# Patient Record
Sex: Male | Born: 1955 | Race: White | Hispanic: No | Marital: Married | Smoking: Former smoker
Health system: Southern US, Community
[De-identification: ages and names within clinical notes are randomized; demographics above are authoritative.]

## PROBLEM LIST (undated history)

## (undated) DIAGNOSIS — M1990 Unspecified osteoarthritis, unspecified site: Secondary | ICD-10-CM

## (undated) DIAGNOSIS — R112 Nausea with vomiting, unspecified: Secondary | ICD-10-CM

## (undated) DIAGNOSIS — Z9889 Other specified postprocedural states: Secondary | ICD-10-CM

## (undated) DIAGNOSIS — T4145XA Adverse effect of unspecified anesthetic, initial encounter: Secondary | ICD-10-CM

## (undated) DIAGNOSIS — T8859XA Other complications of anesthesia, initial encounter: Secondary | ICD-10-CM

## (undated) HISTORY — PX: APPENDECTOMY: SHX54

## (undated) HISTORY — PX: SINUS SURGERY WITH INSTATRAK: SHX5215

---

## 1999-10-13 ENCOUNTER — Inpatient Hospital Stay (HOSPITAL_COMMUNITY): Admission: AD | Admit: 1999-10-13 | Discharge: 1999-10-17 | Payer: Self-pay | Admitting: Internal Medicine

## 1999-10-17 ENCOUNTER — Encounter: Payer: Self-pay | Admitting: Internal Medicine

## 2003-11-02 ENCOUNTER — Encounter: Admission: RE | Admit: 2003-11-02 | Discharge: 2003-11-02 | Payer: Self-pay | Admitting: Neurosurgery

## 2010-04-29 ENCOUNTER — Encounter: Payer: Self-pay | Admitting: Neurosurgery

## 2011-04-12 ENCOUNTER — Ambulatory Visit (INDEPENDENT_AMBULATORY_CARE_PROVIDER_SITE_OTHER): Payer: BC Managed Care – PPO

## 2011-04-12 DIAGNOSIS — M62838 Other muscle spasm: Secondary | ICD-10-CM

## 2011-04-12 DIAGNOSIS — J301 Allergic rhinitis due to pollen: Secondary | ICD-10-CM

## 2011-04-12 DIAGNOSIS — J019 Acute sinusitis, unspecified: Secondary | ICD-10-CM

## 2011-04-12 DIAGNOSIS — M542 Cervicalgia: Secondary | ICD-10-CM

## 2011-08-01 ENCOUNTER — Ambulatory Visit (INDEPENDENT_AMBULATORY_CARE_PROVIDER_SITE_OTHER): Payer: BC Managed Care – PPO | Admitting: Physician Assistant

## 2011-08-01 VITALS — BP 117/72 | HR 60 | Temp 98.1°F | Resp 16 | Ht 70.5 in | Wt 221.2 lb

## 2011-08-01 DIAGNOSIS — E7800 Pure hypercholesterolemia, unspecified: Secondary | ICD-10-CM | POA: Insufficient documentation

## 2011-08-01 DIAGNOSIS — S30861A Insect bite (nonvenomous) of abdominal wall, initial encounter: Secondary | ICD-10-CM

## 2011-08-01 DIAGNOSIS — S30860A Insect bite (nonvenomous) of lower back and pelvis, initial encounter: Secondary | ICD-10-CM

## 2011-08-01 DIAGNOSIS — E782 Mixed hyperlipidemia: Secondary | ICD-10-CM

## 2011-08-01 DIAGNOSIS — L299 Pruritus, unspecified: Secondary | ICD-10-CM

## 2011-08-01 MED ORDER — CETIRIZINE HCL 10 MG PO TABS: 10.0000 mg | ORAL_TABLET | Freq: Every day | ORAL | Status: DC

## 2011-08-01 MED ORDER — DOXYCYCLINE HYCLATE 100 MG PO TABS: 100.0000 mg | ORAL_TABLET | Freq: Two times a day (BID) | ORAL | Status: AC

## 2011-08-01 NOTE — Progress Notes (Signed)
  Subjective:    Patient ID: Stephen Cannon, male    DOB: [birth year], 56 y.o.   MRN: [id number]  HPI 56 yo CM presents S/P pulling a deer tick from his L inguinal region yesterday. He first noticed it bc the area was pruritic.  Today he noticed a rash. He has the tick with him that appears have its mouth parts in tact.  He works for the [employer] and at times goes in [employer], etc. He thinks it couldn't have been there more than a couple of days.  Review of Systems  All other systems reviewed and are negative.       Objective:   Physical Exam  Constitutional: He appears well-developed and well-nourished.  HENT:  Head: Normocephalic and atraumatic.  Neck: Normal range of motion. Neck supple.  Cardiovascular: Normal rate, regular rhythm and normal heart sounds.   Pulmonary/Chest: Effort normal and breath sounds normal.  Skin: Skin is warm and dry.             Assessment & Plan:  Deer tick bite with rash-appears more like a localized skin reaction.  Will cover for tick disease indigenous to this area

## 2011-09-21 ENCOUNTER — Other Ambulatory Visit: Payer: Self-pay | Admitting: *Deleted

## 2011-09-21 ENCOUNTER — Ambulatory Visit (INDEPENDENT_AMBULATORY_CARE_PROVIDER_SITE_OTHER): Payer: BC Managed Care – PPO | Admitting: Family Medicine

## 2011-09-21 VITALS — BP 111/67 | HR 75 | Temp 97.9°F | Resp 18 | Ht 71.0 in | Wt 228.0 lb

## 2011-09-21 DIAGNOSIS — R05 Cough: Secondary | ICD-10-CM

## 2011-09-21 DIAGNOSIS — I499 Cardiac arrhythmia, unspecified: Secondary | ICD-10-CM

## 2011-09-21 DIAGNOSIS — R059 Cough, unspecified: Secondary | ICD-10-CM

## 2011-09-21 DIAGNOSIS — I491 Atrial premature depolarization: Secondary | ICD-10-CM

## 2011-09-21 LAB — POCT CBC
HCT, POC: 46 % (ref 43.5–53.7)
Hemoglobin: 15.3 g/dL (ref 14.1–18.1)
Lymph, poc: 1.7 (ref 0.6–3.4)
MCHC: 33.3 g/dL (ref 31.8–35.4)
MPV: 9.5 fL (ref 0–99.8)
POC Granulocyte: 4.7 (ref 2–6.9)
POC MID %: 12.2 %M — AB (ref 0–12)
RBC: 5.13 M/uL (ref 4.69–6.13)
WBC: 7.3 10*3/uL (ref 4.6–10.2)

## 2011-09-21 MED ORDER — BENZONATATE 100 MG PO CAPS: ORAL_CAPSULE | ORAL | Status: AC

## 2011-09-21 MED ORDER — CETIRIZINE HCL 10 MG PO TABS: 10.0000 mg | ORAL_TABLET | Freq: Every day | ORAL | Status: DC

## 2011-09-21 MED ORDER — BENZONATATE 100 MG PO CAPS: ORAL_CAPSULE | ORAL | Status: DC

## 2011-09-21 NOTE — Progress Notes (Signed)
Subjective: This 56-year-old man who is here with her history of having a lot of congestion. This is not typical for him in the summertime. He doesn't know what he picked up something from someone else had been sick at work or not. He has had head congestion, postnasal drainage, some yellow mucus from his nose, and a cough. He has used Vicks on his chest and under his nose. He has taken OTC Zyrtec today. Last night he broke into a sweat. He has not had any documented fevers. He denies having any palpitations.  Objective: TMs normal the right dull on the left. Nose congested. Throat clear. Neck supple without significant nodes. Chest is clear to auscultation. He is coughing some. Heart regular but has frequent ectopic beats, with some little brief runs of ectopic beats.  Assessment: Cough Irregular heartbeats  Plan: EKG We'll also check a couple of labs including CBC, CMET, and TSH. ekg single pac in 10 pages of rhythm strip

## 2011-09-21 NOTE — Patient Instructions (Addendum)
Call if not better by Tuesday, or if getting worse and we will begin antibiotics

## 2011-09-22 LAB — TSH: TSH: 2.49 u[IU]/mL (ref 0.350–4.500)

## 2011-09-22 LAB — COMPREHENSIVE METABOLIC PANEL
ALT: 22 U/L (ref 0–53)
AST: 19 U/L (ref 0–37)
Albumin: 4.4 g/dL (ref 3.5–5.2)
CO2: 29 mEq/L (ref 19–32)
Calcium: 9.1 mg/dL (ref 8.4–10.5)
Chloride: 107 mEq/L (ref 96–112)
Creat: 1.01 mg/dL (ref 0.50–1.35)
Potassium: 4.4 mEq/L (ref 3.5–5.3)
Sodium: 143 mEq/L (ref 135–145)
Total Protein: 6.3 g/dL (ref 6.0–8.3)

## 2011-09-23 ENCOUNTER — Encounter: Payer: Self-pay | Admitting: Family Medicine

## 2011-09-25 ENCOUNTER — Telehealth: Payer: Self-pay

## 2011-09-25 NOTE — Telephone Encounter (Signed)
Call patient: Treat with amoxicillin 875 mg bid for 10 days (#20)

## 2011-09-25 NOTE — Telephone Encounter (Signed)
Spoke with patient and he stated that he was producing a lot of yellowish mucus.  He is still stopped up and coughing.  Patient would like an antibiotic. He is leaving for the beach tonight.

## 2011-09-25 NOTE — Telephone Encounter (Signed)
PT STATES HE WAS SEEN THE OTHER DAY AND NOW HE IS COUGHING UP YELLOWISH MUCUS, HE ISN'T ANY WORSE OR ANY BETTER, THINK MAYBE HE NEED TO BE ON AN ANTIBIOTIC. IS LEAVING LATE THIS EVENING TO GO TO THE BEACH AND REALLY HOPE TO HEAR FROM SOMEONE BY THEN PLEASE CALL [phone]   [place]

## 2011-09-25 NOTE — Telephone Encounter (Signed)
LMOM that abx was called into [facility]

## 2011-09-30 ENCOUNTER — Ambulatory Visit (INDEPENDENT_AMBULATORY_CARE_PROVIDER_SITE_OTHER): Payer: BC Managed Care – PPO | Admitting: Family Medicine

## 2011-09-30 VITALS — BP 134/84 | HR 61 | Resp 16 | Ht 70.5 in | Wt 224.0 lb

## 2011-09-30 DIAGNOSIS — Z0000 Encounter for general adult medical examination without abnormal findings: Secondary | ICD-10-CM

## 2011-09-30 LAB — POCT UA - MICROSCOPIC ONLY
Bacteria, U Microscopic: NEGATIVE
Casts, Ur, LPF, POC: NEGATIVE
Crystals, Ur, HPF, POC: NEGATIVE
Mucus, UA: NEGATIVE
Yeast, UA: NEGATIVE

## 2011-09-30 LAB — POCT URINALYSIS DIPSTICK
Bilirubin, UA: NEGATIVE
Glucose, UA: NEGATIVE
Ketones, UA: NEGATIVE
Leukocytes, UA: NEGATIVE
Nitrite, UA: NEGATIVE
Protein, UA: NEGATIVE
Spec Grav, UA: 1.02
Urobilinogen, UA: 0.2
pH, UA: 5.5

## 2011-09-30 LAB — POCT CBC
Granulocyte percent: 65.4 %G (ref 37–80)
HCT, POC: 46.5 % (ref 43.5–53.7)
Hemoglobin: 15.1 g/dL (ref 14.1–18.1)
Lymph, poc: 1.8 (ref 0.6–3.4)
MCH, POC: 28.9 pg (ref 27–31.2)
MCHC: 32.5 g/dL (ref 31.8–35.4)
MCV: 89 fL (ref 80–97)
MID (cbc): 0.5 (ref 0–0.9)
MPV: 9.1 fL (ref 0–99.8)
POC Granulocyte: 4.4 (ref 2–6.9)
POC LYMPH PERCENT: 26.5 %L (ref 10–50)
POC MID %: 8.1 %M (ref 0–12)
Platelet Count, POC: 234 10*3/uL (ref 142–424)
RBC: 5.23 M/uL (ref 4.69–6.13)
RDW, POC: 14.2 %
WBC: 6.7 10*3/uL (ref 4.6–10.2)

## 2011-09-30 LAB — IFOBT (OCCULT BLOOD): IFOBT: NEGATIVE

## 2011-09-30 NOTE — Progress Notes (Signed)
@[email or web address] @  Patient ID: Stephen Cannon MRN: [id number], DOB: 09-04-1955 56 y.o. Stephen Cannon MRN: [id number], DOB: 09-04-1955 56 y.o. Date of Encounter: 09/30/2011, 6:46 PM  Primary Physician: Tally Due, MD  Chief Complaint: Physical (CPE)  HPI: 56 y.o. y/o male with history noted below here for CPE.  Doing well.  [place] No issues/complaints.  Now working as Midwife for [employer]  Review of Systems: Consitutional: No fever, chills, fatigue, night sweats, lymphadenopathy, or weight changes. Eyes: No visual changes, eye redness, or discharge. ENT/Mouth: Ears: No otalgia, tinnitus, hearing loss, discharge. Nose: No congestion, rhinorrhea, sinus pain, or epistaxis. Throat: No sore throat, post nasal drip, or teeth pain. Cardiovascular: No CP, palpitations, diaphoresis, DOE, edema, orthopnea, PND. Respiratory: No cough, hemoptysis, SOB, or wheezing. Gastrointestinal: No anorexia, dysphagia, reflux, pain, nausea, vomiting, hematemesis, diarrhea, constipation, BRBPR, or melena. Genitourinary: No dysuria, frequency, urgency, hematuria, incontinence, nocturia, decreased urinary stream, discharge, impotence, or testicular pain/masses. Musculoskeletal: No decreased ROM, myalgias, stiffness, joint swelling, or weakness. Skin: No rash, erythema, lesion changes, pain, warmth, jaundice, or pruritis. Neurological: No headache, dizziness, syncope, seizures, tremors, memory loss, coordination problems, or paresthesias. Psychological: No anxiety, depression, hallucinations, SI/HI. Endocrine: No fatigue, polydipsia, polyphagia, polyuria, or known diabetes. All other systems were reviewed and are otherwise negative.  No past medical history on file.   Past Surgical History  Procedure Date  . Appendectomy     Home Meds:  Prior to Admission medications   Medication Sig Start Date End Date Taking? Authorizing Provider  aspirin 81 MG tablet Take 81 mg by mouth daily.   Yes Historical Provider, MD  Multiple Vitamin (MULTIVITAMIN) tablet Take 1 tablet  by mouth daily.   Yes Historical Provider, MD  cetirizine (ZYRTEC) 10 MG tablet Take 1 tablet (10 mg total) by mouth daily. 09/21/11 09/20/12  Peyton Najjar, MD  pravastatin (PRAVACHOL) 40 MG tablet Take 40 mg by mouth daily.    Historical Provider, MD    Allergies: No Known Allergies  History   Social History  . Marital Status: Single    Spouse Name: N/A    Number of Children: N/A  . Years of Education: N/A   Occupational History  . Not on file.   Social History Main Topics  . Smoking status: Never Smoker   . Smokeless tobacco: Not on file  . Alcohol Use: No  . Drug Use: No  . Sexually Active: Yes   Other Topics Concern  . Not on file   Social History Narrative  . No narrative on file    No family history on file.  Physical Exam: Blood pressure 134/84, pulse 61, resp. rate 16, height 5' 10.5" (1.791 m), weight 224 lb (101.606 kg).  General: Well developed, well nourished, in no acute distress. HEENT: Normocephalic, atraumatic. Conjunctiva pink, sclera non-icteric. Pupils 2 mm constricting to 1 mm, round, regular, and equally reactive to light and accomodation. EOMI. Internal auditory canal clear. TMs with good cone of light and without pathology. Nasal mucosa pink. Nares are without discharge. No sinus tenderness. Oral mucosa pink. Dentition good. Pharynx without exudate.   Neck: Supple. Trachea midline. No thyromegaly. Full ROM. No lymphadenopathy. Lungs: Clear to auscultation bilaterally without wheezes, rales, or rhonchi. Breathing is of normal effort and unlabored. Cardiovascular: RRR with S1 S2. No murmurs, rubs, or gallops appreciated. Distal pulses 2+ symmetrically. No carotid or abdominal bruits. Abdomen: Soft, non-tender, non-distended with normoactive bowel sounds. No hepatosplenomegaly or masses. No rebound/guarding. No CVA tenderness. Without hernias.  Rectal: No external hemorrhoids or fissures. Rectal vault without  masses.  Genitourinary:  circumcised male. No  penile lesions. Testes descended bilaterally, and smooth without tenderness or masses.  Musculoskeletal: Full range of motion and 5/5 strength throughout. Without swelling, atrophy, tenderness, crepitus, or warmth. Extremities without clubbing, cyanosis, or edema. Calves supple. Skin: Warm and moist without erythema, ecchymosis, wounds, or rash. Neuro: A+Ox3. CN II-XII grossly intact. Moves all extremities spontaneously. Full sensation throughout. Normal gait. DTR 2+ throughout upper and lower extremities. Finger to nose intact. Psych:  Responds to questions appropriately with a normal affect.   Studies:  Patient is healthy.  Forms completed   Assessment/Plan:  56 y.o. y/o healthy male here for CPE -  Signed, Elvina Sidle, MD 09/30/2011 6:46 PM

## 2011-10-01 LAB — COMPREHENSIVE METABOLIC PANEL
ALT: 23 U/L (ref 0–53)
AST: 24 U/L (ref 0–37)
Albumin: 5.1 g/dL (ref 3.5–5.2)
Alkaline Phosphatase: 47 U/L (ref 39–117)
BUN: 14 mg/dL (ref 6–23)
CO2: 28 mEq/L (ref 19–32)
Calcium: 9.5 mg/dL (ref 8.4–10.5)
Chloride: 103 mEq/L (ref 96–112)
Creat: 0.97 mg/dL (ref 0.50–1.35)
Glucose, Bld: 75 mg/dL (ref 70–99)
Potassium: 4.3 mEq/L (ref 3.5–5.3)
Sodium: 139 mEq/L (ref 135–145)
Total Bilirubin: 0.5 mg/dL (ref 0.3–1.2)
Total Protein: 7 g/dL (ref 6.0–8.3)

## 2011-10-01 LAB — LIPID PANEL
Cholesterol: 229 mg/dL — ABNORMAL HIGH (ref 0–200)
HDL: 50 mg/dL (ref >39)
LDL Cholesterol: 158 mg/dL — ABNORMAL HIGH (ref 0–99)
Total CHOL/HDL Ratio: 4.6 Ratio
Triglycerides: 106 mg/dL (ref <150)
VLDL: 21 mg/dL (ref 0–40)

## 2011-10-03 ENCOUNTER — Other Ambulatory Visit: Payer: Self-pay | Admitting: Family Medicine

## 2011-10-03 DIAGNOSIS — E785 Hyperlipidemia, unspecified: Secondary | ICD-10-CM

## 2011-10-03 MED ORDER — PRAVASTATIN SODIUM 80 MG PO TABS: 80.0000 mg | ORAL_TABLET | Freq: Every day | ORAL | Status: DC

## 2011-10-05 ENCOUNTER — Encounter: Payer: Self-pay | Admitting: Radiology

## 2011-10-13 ENCOUNTER — Other Ambulatory Visit: Payer: Self-pay | Admitting: Emergency Medicine

## 2011-10-13 ENCOUNTER — Other Ambulatory Visit: Payer: Self-pay | Admitting: Physician Assistant

## 2011-10-13 MED ORDER — PRAVASTATIN SODIUM 40 MG PO TABS: 40.0000 mg | ORAL_TABLET | Freq: Every day | ORAL | Status: AC

## 2011-12-09 ENCOUNTER — Ambulatory Visit (INDEPENDENT_AMBULATORY_CARE_PROVIDER_SITE_OTHER): Payer: BC Managed Care – PPO | Admitting: Physician Assistant

## 2011-12-09 VITALS — BP 125/79 | HR 88 | Temp 98.0°F | Resp 16 | Ht 70.5 in | Wt 229.0 lb

## 2011-12-09 DIAGNOSIS — J309 Allergic rhinitis, unspecified: Secondary | ICD-10-CM

## 2011-12-09 DIAGNOSIS — H1010 Acute atopic conjunctivitis, unspecified eye: Secondary | ICD-10-CM

## 2011-12-09 DIAGNOSIS — H1045 Other chronic allergic conjunctivitis: Secondary | ICD-10-CM

## 2011-12-09 MED ORDER — IPRATROPIUM BROMIDE 0.03 % NA SOLN: 2.0000 | Freq: Two times a day (BID) | NASAL | Status: DC

## 2011-12-09 MED ORDER — AZELASTINE HCL 0.05 % OP SOLN: 1.0000 [drp] | Freq: Two times a day (BID) | OPHTHALMIC | Status: DC

## 2011-12-09 MED ORDER — POLYMYXIN B-TRIMETHOPRIM 10000-0.1 UNIT/ML-% OP SOLN: 1.0000 [drp] | OPHTHALMIC | Status: AC

## 2011-12-09 NOTE — Patient Instructions (Addendum)
Take Zyrtec daily in the morning and Benadryl 25-50 mg at bedtime Use azelastine eye drops to both eyes to help with itching If no improvement tomorrow, call and we will refer to eye doctor.

## 2011-12-09 NOTE — Progress Notes (Signed)
  Subjective:    Patient ID: Stephen Cannon, male    DOB: 10-24-1955, 56 y.o.   MRN: [id number]  Eye Problem    56 year old male presents with acute onset of eye redness, irritation, and pruritis.  He was working in the yard this morning and there was grass blowing in his face and eyes. He was wearing eye protection but believes he may have still been exposed to something.  Admits to clear rhinorrhea, nasal congestion, watery drainage from both eyes, and eye erythema and foreign body sensation.  Denies SOB, wheezing, headache, lip/tongue swelling. Denies eye pain, mostly complains of itching.     Review of Systems  All other systems reviewed and are negative.       Objective:   Physical Exam  Constitutional: He is oriented to person, place, and time. He appears well-developed and well-nourished.  HENT:  Head: Normocephalic and atraumatic.  Right Ear: Hearing, tympanic membrane, external ear and ear canal normal.  Left Ear: Hearing, tympanic membrane, external ear and ear canal normal.  Mouth/Throat: Uvula is midline, oropharynx is clear and moist and mucous membranes are normal. No oropharyngeal exudate.  Eyes: EOM are normal. Pupils are equal, round, and reactive to light. Right conjunctiva is injected. Left conjunctiva is injected.       Right eyelid slightly inflamed  Neck: Normal range of motion. Neck supple.  Cardiovascular: Normal rate, regular rhythm and normal heart sounds.   Pulmonary/Chest: Effort normal and breath sounds normal.  Lymphadenopathy:    He has no cervical adenopathy.  Neurological: He is alert and oriented to person, place, and time.  Psychiatric: He has a normal mood and affect. His behavior is normal. Judgment and thought content normal.          Assessment & Plan:   1. Allergic conjunctivitis  Use Optivar drops twice daily to help with erythema and pruritis. Start Zyrtec daily in the morning and Benadryl at night azelastine (OPTIVAR) 0.05 %  ophthalmic solution  2. Allergic rhinitis  Patient declined prednisone today ipratropium (ATROVENT) 0.03 % nasal spray  Instructed him to let us know if symptoms are worsening or fail to improve.  May need to consider opthalmology referral if no improvement.

## 2011-12-10 ENCOUNTER — Telehealth: Payer: Self-pay

## 2011-12-10 NOTE — Telephone Encounter (Signed)
I have spoken to patient and he is still having trouble with eyes left greater than right. He has seen Dr Dione Booze in the past and was given his number to self schedule an appt. I have faxed note to Dr Dione Booze for his review.

## 2011-12-10 NOTE — Telephone Encounter (Signed)
PT STATES HE WAS SEEN FOR AN ALLERGIC REACTION YESTERDAY AND HIS EYES ARE REALLY SENSITIVE AND RED. PLEASE CALL [phone]

## 2012-01-29 ENCOUNTER — Encounter: Payer: Self-pay | Admitting: *Deleted

## 2012-01-29 DIAGNOSIS — H1010 Acute atopic conjunctivitis, unspecified eye: Secondary | ICD-10-CM | POA: Insufficient documentation

## 2012-07-21 ENCOUNTER — Ambulatory Visit: Payer: BC Managed Care – PPO | Admitting: Emergency Medicine

## 2012-08-03 ENCOUNTER — Ambulatory Visit: Payer: BC Managed Care – PPO | Admitting: Emergency Medicine

## 2012-08-11 ENCOUNTER — Ambulatory Visit (INDEPENDENT_AMBULATORY_CARE_PROVIDER_SITE_OTHER): Payer: BC Managed Care – PPO | Admitting: Emergency Medicine

## 2012-08-11 ENCOUNTER — Encounter: Payer: Self-pay | Admitting: Emergency Medicine

## 2012-08-11 VITALS — BP 116/74 | HR 65 | Temp 98.8°F | Resp 16 | Ht 71.0 in | Wt 231.0 lb

## 2012-08-11 DIAGNOSIS — R5383 Other fatigue: Secondary | ICD-10-CM

## 2012-08-11 DIAGNOSIS — R0989 Other specified symptoms and signs involving the circulatory and respiratory systems: Secondary | ICD-10-CM

## 2012-08-11 DIAGNOSIS — R0683 Snoring: Secondary | ICD-10-CM

## 2012-08-11 DIAGNOSIS — E785 Hyperlipidemia, unspecified: Secondary | ICD-10-CM

## 2012-08-11 LAB — LIPID PANEL
HDL: 58 mg/dL (ref >39)
LDL Cholesterol: 101 mg/dL — ABNORMAL HIGH (ref 0–99)
Triglycerides: 90 mg/dL (ref <150)

## 2012-08-11 NOTE — Progress Notes (Signed)
  Subjective:    Patient ID: DAVARIUS RIDENER, male    DOB: 1955-10-05, 57 y.o.   MRN: [id number]  HPI  57 y.o male patient here today to follow up on his cholesterol. He had stopped taking his cholesterol medication for a little bit due to muscle aches. He restarted it about 2 months later after having blood work done. As a result of that blood work it was recommended he should double his dose. He decided to just restart the medication and watch his diet.  He has been taking Pravastatin for about 10 months. Patient states he is fasting today to have blood drawn.    Patient reports he is also here today to talk about a snoring. His wife has complained about this to him numerous times. He will turn onto his left side and it does stop. He has had some day time fatigue. He has increased his pillows to two at night and he reports it has gotten better. He denies acid reflux. He has been having issues with nasal congestion. Especially on his left side. He was wondering if he should have a sleep study.    Review of Systems      Objective:   Physical Exam Chest clear Heart RRR      Assessment & Plan:  Check sleep study. Check Lipid Panel

## 2012-08-12 ENCOUNTER — Encounter: Payer: Self-pay | Admitting: *Deleted

## 2012-08-18 ENCOUNTER — Ambulatory Visit (INDEPENDENT_AMBULATORY_CARE_PROVIDER_SITE_OTHER): Payer: BC Managed Care – PPO | Admitting: Family Medicine

## 2012-08-18 VITALS — BP 138/76 | HR 103 | Temp 100.4°F | Resp 16 | Ht 71.75 in | Wt 231.6 lb

## 2012-08-18 DIAGNOSIS — R5381 Other malaise: Secondary | ICD-10-CM

## 2012-08-18 DIAGNOSIS — R51 Headache: Secondary | ICD-10-CM

## 2012-08-18 DIAGNOSIS — R6889 Other general symptoms and signs: Secondary | ICD-10-CM

## 2012-08-18 DIAGNOSIS — IMO0001 Reserved for inherently not codable concepts without codable children: Secondary | ICD-10-CM

## 2012-08-18 DIAGNOSIS — M2550 Pain in unspecified joint: Secondary | ICD-10-CM

## 2012-08-18 DIAGNOSIS — J111 Influenza due to unidentified influenza virus with other respiratory manifestations: Secondary | ICD-10-CM

## 2012-08-18 LAB — POCT CBC
Lymph, poc: 0.3 — AB (ref 0.6–3.4)
MCH, POC: 29.9 pg (ref 27–31.2)
MCHC: 32.2 g/dL (ref 31.8–35.4)
MID (cbc): 0.2 (ref 0–0.9)
MPV: 9.7 fL (ref 0–99.8)
POC Granulocyte: 4.8 (ref 2–6.9)
POC LYMPH PERCENT: 5.9 %L — AB (ref 10–50)
POC MID %: 3.7 %M (ref 0–12)
Platelet Count, POC: 153 10*3/uL (ref 142–424)
RDW, POC: 13.6 %
WBC: 5.3 10*3/uL (ref 4.6–10.2)

## 2012-08-18 LAB — COMPREHENSIVE METABOLIC PANEL
ALT: 25 U/L (ref 0–53)
Alkaline Phosphatase: 42 U/L (ref 39–117)
CO2: 25 mEq/L (ref 19–32)
Creat: 0.92 mg/dL (ref 0.50–1.35)
Glucose, Bld: 105 mg/dL — ABNORMAL HIGH (ref 70–99)
Total Bilirubin: 0.8 mg/dL (ref 0.3–1.2)

## 2012-08-18 LAB — POCT SEDIMENTATION RATE: POCT SED RATE: 13 mm/hr (ref 0–22)

## 2012-08-18 LAB — POCT UA - MICROSCOPIC ONLY
Bacteria, U Microscopic: NEGATIVE
Casts, Ur, LPF, POC: NEGATIVE
Crystals, Ur, HPF, POC: NEGATIVE
Yeast, UA: NEGATIVE

## 2012-08-18 LAB — POCT URINALYSIS DIPSTICK
Glucose, UA: NEGATIVE
Nitrite, UA: NEGATIVE
Spec Grav, UA: 1.025
Urobilinogen, UA: 1

## 2012-08-18 LAB — POCT INFLUENZA A/B
Influenza A, POC: NEGATIVE
Influenza B, POC: NEGATIVE

## 2012-08-18 MED ORDER — DOXYCYCLINE HYCLATE 100 MG PO TABS: 100.0000 mg | ORAL_TABLET | Freq: Two times a day (BID) | ORAL | Status: DC

## 2012-08-18 NOTE — Patient Instructions (Addendum)
[place] Spotted Fever [place] Spotted Fever (RMSF) is the oldest known tick-borne disease of people in the [place]. This disease was named because it was first described among people in the [place] area who had an illness characterized by a rash with red-purple-black spots. This disease is caused by a rickettsia (Rickettsia rickettsii), a bacteria carried by the tick. The [place] wood tick and the [place] dog tick, acquire and transmit the RMSF bacteria (pictures NOT actual size). When a larval, nymphal or adult tick feeds on an infected rodent or larger animal, the tick can become infected. Infected adult ticks then feed on people who may then get RMSF. The tick transmits the disease to humans during a prolonged period of feeding that lasts many hours, days or even a couple weeks. The bite is painless and frequently goes unnoticed. An infected male tick may also pass the rickettsial bacteria to her eggs that then may mature to be infected adult ticks. The rickettsia that causes RMSF can also get into a person's body through damaged skin. A tick bite is not necessary. People can get RMSF if they crush a tick and get it's blood or body fluids on their skin through a small cut or sore.  DIAGNOSIS Diagnosis is made by laboratory tests.  TREATMENT Treatment is with antibiotics (medications that kill rickettsia and other bacteria). Immediate treatment usually prevents death. GEOGRAPHIC RANGE This disease was reported only in the [place] until 1931. RMSF has more recently been described among individuals in all states except [place], [place] and [place]. The highest reported incidences of RMSF now occur among residents of [place], [place], [place] and the [place]. TIME OF YEAR  Most cases are diagnosed during late spring and summer when ticks are most active. However, especially in the warmer southern states, a few cases occur during the winter. SYMPTOMS    Symptoms of RMSF begin from 2 to 14 days after a tick bite. The most common early symptoms are fever, muscle aches and headache followed by nausea (feeling sick to your stomach) or vomiting.  The RMSF rash is typically delayed until 3 or more days after symptom onset, and eventually develops in 9 of 10 infected patients by the 5th day of illness. If the disease is not treated it can cause death. If you get a fever, headache, muscle aches, rash, nausea or vomiting within 2 weeks of a possible tick bite or exposure you should see your caregiver immediately. PREVENTION Ticks prefer to hide in shady, moist ground litter. They can often be found above the ground clinging to tall grass, brush, shrubs and low tree branches. They also inhabit lawns and gardens, especially at the edges of woodlands and around old stone walls. Within the areas where ticks generally live, no naturally vegetated area can be considered completely free of infected ticks. The best precaution against RMSF is to avoid contact with soil, leaf litter and vegetation as much as possible in tick infested areas. For those who enjoy gardening or walking in their yards, clear brush and mow tall grass around houses and at the edges of gardens. This may help reduce the tick population in the immediate area. Applications of chemical insecticides by a licensed professional in the spring (late May) and Fall (September) will also control ticks, especially in heavily infested areas. Treatment will never get rid of all the ticks. Getting rid of small animal populations that host ticks will also decrease the tick population. When working in the garden,   pruning shrubs, or handling soil and vegetation, wear light-colored protective clothing and gloves. Spot-check often to prevent ticks from reaching the skin. Ticks cannot jump or fly. They will not drop from an above-ground perch onto a passing animal. Once a tick gains access to human skin it climbs upward  until it reaches a more protected area. For example, the back of the knee, groin, navel, armpit, ears or nape of the neck. It then begins the slow process of embedding itself in the skin. Campers, hikers, field workers, and others who spend time in wooded, brushy or tall grassy areas can avoid exposure to ticks by using the following precautions:  Wear light-colored clothing with a tight weave to spot ticks more easily and prevent contact with the skin.  Wear long pants tucked into socks, long-sleeved shirts tucked into pants and enclosed shoes or boots along with insect repellent.  Spray clothes with insect repellent containing either DEET or Permethrin. Only DEET can be used on exposed skin. Follow the manufacturer's directions carefully.  Wear a hat and keep long hair pulled back.  Stay on cleared, well-worn trails whenever possible.  Spot-check yourself and others often for the presence of ticks on clothes. If you find one, there are likely to be others. Check thoroughly.  Remove clothes after leaving tick-infested areas. If possible, wash them to eliminate any unseen ticks. Check yourself, your children and any pets from head to toe for the presence of ticks.  Shower and shampoo. You can greatly reduce your chances of contracting RMSF if you remove attached ticks as soon as possible. Regular checks of the body, including all body sites covered by hair (head, armpits, genitals), allow removal of the tick before rickettsial transmission. To remove an attached tick, use a forceps or tweezers to detach the intact tick without leaving mouth parts in the skin. The tick bite wound should be cleansed after tick removal. Remember the most common symptoms of RMSF are fever, muscle aches, headache and nausea or vomiting with a later onset of rash. If you get these symptoms after a tick bite and while living in an area where RMSF is found, RMSF should be suspected. If the disease is not treated, it can  cause death. See your caregiver immediately if you get these symptoms. Do this even if not aware of a tick bite. Document Released: 07/07/2000 Document Revised: 06/17/2011 Document Reviewed: 02/27/2009 [employer] Patient Information 2013 [employer].  

## 2012-08-18 NOTE — Progress Notes (Signed)
Subjective: 57 year old man who was out on a [employer] camping weekend Friday until Sunday. He felt okay yesterday. This morning he woke up feeling just lousy. His wife said his eyes were bloodshot. He ate all over. His back ache in his head the eighth of his knees ache. He had some nausea but did not vomit. He a little bit of loose stool. He did feel chilled at one point today. He finally left work and went home early in a nap. He feels little bit better now, though he still feels pretty rough. He usually is I energetic healthy man. He may have had a tick on his right thigh last week or so, but it seemed to just be dried out and dead. Doesn't know of any exposure to anyone else with these symptoms. He saw him yesterday but doesn't think that was a problem.  Objective: Flushed face. TMs normal. Throat clear. Eyes do not look particularly injected now, maybe mildly. His cheeks are flushed. His chest is clear. Heart regular without murmurs. Nose a little congested. Abdomen soft, mild tenderness on the left. Skin unremarkable except for a few little bite-looking lesions on his lower thighs and legs, none noted in the areas that would've been under clothing.  Assessment: Generalized malaise, myalgias, arthralgias, headache  Plan: Check CBC, sedimentation rate, RMSF titer, C. met, UA.  Results for orders placed in visit on 08/18/12  POCT CBC      Result Value Range   WBC 5.3  4.6 - 10.2 K/uL   Lymph, poc 0.3 (*) 0.6 - 3.4   POC LYMPH PERCENT 5.9 (*) 10 - 50 %L   MID (cbc) 0.2  0 - 0.9   POC MID % 3.7  0 - 12 %M   POC Granulocyte 4.8  2 - 6.9   Granulocyte percent 90.4 (*) 37 - 80 %G   RBC 5.01  4.69 - 6.13 M/uL   Hemoglobin 15.0  14.1 - 18.1 g/dL   HCT, POC 40.9  81.1 - 53.7 %   MCV 93.1  80 - 97 fL   MCH, POC 29.9  27 - 31.2 pg   MCHC 32.2  31.8 - 35.4 g/dL   RDW, POC 91.4     Platelet Count, POC 153  142 - 424 K/uL   MPV 9.7  0 - 99.8 fL  POCT UA - MICROSCOPIC ONLY      Result Value  Range   WBC, Ur, HPF, POC 1-3     RBC, urine, microscopic 10-20     Bacteria, U Microscopic neg     Mucus, UA moderate     Epithelial cells, urine per micros neg     Crystals, Ur, HPF, POC neg     Casts, Ur, LPF, POC neg     Yeast, UA neg    POCT URINALYSIS DIPSTICK      Result Value Range   Color, UA amber     Clarity, UA clear     Glucose, UA neg     Bilirubin, UA small     Ketones, UA 40     Spec Grav, UA 1.025     Blood, UA moderate     pH, UA 5.5     Protein, UA trace     Urobilinogen, UA 1.0     Nitrite, UA neg     Leukocytes, UA Negative     I suspect this is just a viral syndrome. However I do want to make certain it  is not recommends that a fever some covering him with doxycycline. Will recheck him in 2 days, sooner if worse.

## 2012-08-19 LAB — ROCKY MTN SPOTTED FVR AB, IGM-BLOOD: ROCKY MTN SPOTTED FEVER, IGM: 0.35 IV

## 2012-09-30 ENCOUNTER — Ambulatory Visit (INDEPENDENT_AMBULATORY_CARE_PROVIDER_SITE_OTHER): Payer: BC Managed Care – PPO | Admitting: Physician Assistant

## 2012-09-30 VITALS — BP 116/70 | HR 66 | Temp 98.0°F | Resp 16 | Ht 72.0 in | Wt 229.0 lb

## 2012-09-30 DIAGNOSIS — T148 Other injury of unspecified body region: Secondary | ICD-10-CM

## 2012-09-30 DIAGNOSIS — W57XXXA Bitten or stung by nonvenomous insect and other nonvenomous arthropods, initial encounter: Secondary | ICD-10-CM

## 2012-09-30 MED ORDER — DOXYCYCLINE HYCLATE 100 MG PO CAPS: 100.0000 mg | ORAL_CAPSULE | Freq: Two times a day (BID) | ORAL | Status: DC

## 2012-09-30 NOTE — Progress Notes (Signed)
Patient ID: Stephen Cannon MRN: [id number], DOB: 1955-12-02, 57 y.o. Date of Encounter: 09/30/2012, 6:29 PM  Primary Physician: Tally Due, MD  Chief Complaint: Tick bite  HPI: 57 y.o. male presents with tick bite along the left upper leg. Patient first noticed the tick on one week ago while [place] the beach. Tick was removed at that time. One day after the tick bite he had some localized erythema followed by some localized soft tissue swelling. As the days went by this resolved on its own to the point now where the bite is now mostly fully resolved. He does complain of just feeling a little more fatigued than usual, but he is not sure if that is from just getting back from vacation. Afebrile. No chills. No rash. No myalgias or arthralgias. No headache. No photophobia.   He states that he and his family were [place] on an [place] looking at some wild horses and the horses were covered in engorged ticks. They did not go off into the brush far, just a few feet. His brother stay on the beach and still got a tick bite he states.    History reviewed. No pertinent past medical history.   Home Meds: Prior to Admission medications   Medication Sig Start Date End Date Taking? Authorizing Provider  aspirin 81 MG tablet Take 81 mg by mouth daily.   Yes Historical Provider, MD  Multiple Vitamin (MULTIVITAMIN) tablet Take 1 tablet by mouth daily.   Yes Historical Provider, MD  pravastatin (PRAVACHOL) 40 MG tablet Take 1 tablet (40 mg total) by mouth daily. 10/13/11 10/12/12 Yes Anders Simmonds, PA-C  cetirizine (ZYRTEC) 10 MG tablet Take 1 tablet (10 mg total) by mouth daily. 09/21/11 09/20/12  Peyton Najjar, MD           Allergies: No Known Allergies  History   Social History  . Marital Status: Married    Spouse Name: N/A    Number of Children: N/A  . Years of Education: N/A   Occupational History  . Not on file.   Social History Main Topics  . Smoking status: Never Smoker   .  Smokeless tobacco: Not on file  . Alcohol Use: No  . Drug Use: No  . Sexually Active: Yes   Other Topics Concern  . Not on file   Social History Narrative  . No narrative on file     Review of Systems: Constitutional: positive for fatigue. negative for chills or fever HEENT: negative for vision changes or hearing loss Cardiovascular: negative for chest pain or palpitations Abdominal: negative for abdominal pain, nausea, vomiting, or diarrhea Dermatological: negative for rash Neurologic: negative for headache, dizziness, or syncope   Physical Exam: Blood pressure 116/70, pulse 66, temperature 98 F (36.7 C), temperature source Oral, resp. rate 16, height 6' (1.829 m), weight 229 lb (103.874 kg), SpO2 100.00%., Body mass index is 31.05 kg/(m^2). General: Well developed, well nourished, in no acute distress. Head: Normocephalic, atraumatic, eyes without discharge, sclera non-icteric, nares are without discharge.   Neck: Supple. Full ROM.  Lungs: Clear bilaterally to auscultation without wheezes, rales, or rhonchi. Breathing is unlabored. Heart: RRR with S1 S2. No murmurs, rubs, or gallops appreciated. Msk:  Strength and tone normal for age. Extremities/Skin: Warm and dry. No clubbing or cyanosis. No edema. No rashes. Patient's pictures examined via camera phone indicating previous localized cellulitis, now resolved. Left upper leg: Bite site with mild induration. No fluctuance or erythema. No  TTP.  Neuro: Alert and oriented X 3. Moves all extremities spontaneously. Gait is normal. CNII-XII grossly in tact. Psych:  Responds to questions appropriately with a normal affect.     ASSESSMENT AND PLAN:  57 y.o. male with tick bite left upper leg.  -Doxycycline 100 mg 1 po bid #28 no RF -No signs of cellulitis or active tick borne illness -RTC prn   Signed, Eula Listen, PA-C 09/30/2012 6:29 PM

## 2012-11-22 ENCOUNTER — Other Ambulatory Visit: Payer: Self-pay | Admitting: Internal Medicine

## 2013-01-11 ENCOUNTER — Other Ambulatory Visit: Payer: Self-pay | Admitting: Emergency Medicine

## 2013-01-11 ENCOUNTER — Telehealth: Payer: Self-pay

## 2013-01-11 DIAGNOSIS — J3489 Other specified disorders of nose and nasal sinuses: Secondary | ICD-10-CM

## 2013-01-11 NOTE — Telephone Encounter (Signed)
PT STATES ON HIS LAST VISIT WITH DR DAUB,HE MENTIONED THAT ONE SIDE OF HIS NOSTRIL CONTINUALLY STAYS STOPPED UP AND HE WOULD LIKE A REFERRAL TO AN ENT.   BEST PHONE [phone]

## 2013-01-11 NOTE — Telephone Encounter (Signed)
Called patient that a referral has been made to ear nose and throat

## 2013-01-12 NOTE — Telephone Encounter (Signed)
I called patient to advise.  

## 2013-05-30 ENCOUNTER — Ambulatory Visit (INDEPENDENT_AMBULATORY_CARE_PROVIDER_SITE_OTHER): Payer: BC Managed Care – PPO | Admitting: Emergency Medicine

## 2013-05-30 VITALS — BP 128/76 | HR 80 | Temp 98.1°F | Resp 16 | Ht 71.0 in | Wt 233.8 lb

## 2013-05-30 DIAGNOSIS — R059 Cough, unspecified: Secondary | ICD-10-CM

## 2013-05-30 DIAGNOSIS — R05 Cough: Secondary | ICD-10-CM

## 2013-05-30 DIAGNOSIS — J019 Acute sinusitis, unspecified: Secondary | ICD-10-CM

## 2013-05-30 LAB — POCT INFLUENZA A/B
INFLUENZA B, POC: NEGATIVE
Influenza A, POC: NEGATIVE

## 2013-05-30 LAB — POCT RAPID STREP A (OFFICE): RAPID STREP A SCREEN: NEGATIVE

## 2013-05-30 MED ORDER — BENZONATATE 200 MG PO CAPS: 200.0000 mg | ORAL_CAPSULE | Freq: Three times a day (TID) | ORAL | Status: DC | PRN

## 2013-05-30 MED ORDER — AMOXICILLIN 875 MG PO TABS: 875.0000 mg | ORAL_TABLET | Freq: Two times a day (BID) | ORAL | Status: DC

## 2013-05-30 MED ORDER — FLUTICASONE PROPIONATE 50 MCG/ACT NA SUSP: 2.0000 | Freq: Every day | NASAL | Status: DC

## 2013-05-30 NOTE — Progress Notes (Signed)
   Subjective:    Patient ID: Stephen Cannon, male    DOB: 12/23/1955, 58 y.o.   MRN: [id number]  HPI patient started feeling bad 48 hours ago. He has had significant head congestion initially clear but now yellowish nasal drainage. He has a sore throat and a mild cough. He feels very congested in his head. His whole family has been sick.    Review of Systems     Objective:   Physical Exam  Constitutional: He appears well-developed and well-nourished.  HENT:  Right Ear: External ear normal.   The left ear is slightly red. There is mild redness of the posterior pharynx  Eyes: Conjunctivae are normal. Pupils are equal, round, and reactive to light.  Neck: Normal range of motion. No thyromegaly present.  Cardiovascular: Normal rate, regular rhythm and normal heart sounds.   Pulmonary/Chest: Effort normal and breath sounds normal. He has no wheezes. He has no rales.   Results for orders placed in visit on 05/30/13  POCT INFLUENZA A/B      Result Value Ref Range   Influenza A, POC Negative     Influenza B, POC Negative    POCT RAPID STREP A (OFFICE)      Result Value Ref Range   Rapid Strep A Screen Negative  Negative         Assessment & Plan:  We'll treat with Flonase and amoxicillin. Strep and flu test were both negative .

## 2013-06-01 LAB — CULTURE, GROUP A STREP: Organism ID, Bacteria: NORMAL

## 2013-07-09 ENCOUNTER — Ambulatory Visit (INDEPENDENT_AMBULATORY_CARE_PROVIDER_SITE_OTHER): Payer: BC Managed Care – PPO | Admitting: Internal Medicine

## 2013-07-09 VITALS — BP 116/72 | HR 74 | Temp 97.9°F | Resp 18 | Ht 72.0 in | Wt 234.0 lb

## 2013-07-09 DIAGNOSIS — R52 Pain, unspecified: Secondary | ICD-10-CM

## 2013-07-09 DIAGNOSIS — R5381 Other malaise: Secondary | ICD-10-CM

## 2013-07-09 DIAGNOSIS — J40 Bronchitis, not specified as acute or chronic: Secondary | ICD-10-CM

## 2013-07-09 DIAGNOSIS — R5383 Other fatigue: Secondary | ICD-10-CM

## 2013-07-09 MED ORDER — HYDROCODONE-ACETAMINOPHEN 7.5-325 MG/15ML PO SOLN: 5.0000 mL | Freq: Four times a day (QID) | ORAL | Status: DC | PRN

## 2013-07-09 MED ORDER — AZITHROMYCIN 500 MG PO TABS: 500.0000 mg | ORAL_TABLET | Freq: Every day | ORAL | Status: DC

## 2013-07-09 NOTE — Progress Notes (Signed)
   Subjective:    Patient ID: Stephen Cannon Reasons, male    DOB: 01/13/1956, 58 y.o.   MRN: [id number]  HPI Patient here c/o of cough, sore throat, and otalgia. Started a week ago. otc Tylenol and Advil and mucinex DM with some relief. Denies fevers, vomiting, or diarrhea.  Body aches and fatigue. All family members have been in here for this. No sob, cp, hemoptysis.   Review of Systems     Objective:   Physical Exam  Constitutional: He is oriented to person, place, and time. He appears well-developed and well-nourished. No distress.  HENT:  Head: Normocephalic.  Right Ear: Hearing, external ear and ear canal normal. Tympanic membrane is injected.  Left Ear: Hearing, tympanic membrane, external ear and ear canal normal.  Nose: Mucosal edema, rhinorrhea and sinus tenderness present.  Mouth/Throat: No uvula swelling. Posterior oropharyngeal erythema present.  Eyes: Conjunctivae and EOM are normal. Pupils are equal, round, and reactive to light.  Neck: Normal range of motion. Neck supple.  Cardiovascular: Normal rate and normal heart sounds.   Pulmonary/Chest: Effort normal. Not tachypneic. He has no decreased breath sounds. He has wheezes. He has rhonchi. He has no rales.  Musculoskeletal: Normal range of motion. He exhibits tenderness.  Neurological: He is alert and oriented to person, place, and time. He exhibits normal muscle tone. Coordination normal.  Psychiatric: He has a normal mood and affect. His behavior is normal.          Assessment & Plan:  Zithromax 500mg Leandro Reasoner/Lortab elixir

## 2013-07-09 NOTE — Patient Instructions (Signed)
Acute Bronchitis Bronchitis is inflammation of the airways that extend from the windpipe into the lungs (bronchi). The inflammation often causes mucus to develop. This leads to a cough, which is the most common symptom of bronchitis.  In acute bronchitis, the condition usually develops suddenly and goes away over time, usually in a couple weeks. Smoking, allergies, and asthma can make bronchitis worse. Repeated episodes of bronchitis may cause further lung problems.  CAUSES Acute bronchitis is most often caused by the same virus that causes a cold. The virus can spread from person to person (contagious).  SIGNS AND SYMPTOMS   Cough.   Fever.   Coughing up mucus.   Body aches.   Chest congestion.   Chills.   Shortness of breath.   Sore throat.  DIAGNOSIS  Acute bronchitis is usually diagnosed through a physical exam. Tests, such as chest X-rays, are sometimes done to rule out other conditions.  TREATMENT  Acute bronchitis usually goes away in a couple weeks. Often times, no medical treatment is necessary. Medicines are sometimes given for relief of fever or cough. Antibiotics are usually not needed but may be prescribed in certain situations. In some cases, an inhaler may be recommended to help reduce shortness of breath and control the cough. A cool mist vaporizer may also be used to help thin bronchial secretions and make it easier to clear the chest.  HOME CARE INSTRUCTIONS  Get plenty of rest.   Drink enough fluids to keep your urine clear or pale yellow (unless you have a medical condition that requires fluid restriction). Increasing fluids may help thin your secretions and will prevent dehydration.   Only take over-the-counter or prescription medicines as directed by your health care provider.   Avoid smoking and secondhand smoke. Exposure to cigarette smoke or irritating chemicals will make bronchitis worse. If you are a smoker, consider using nicotine gum or skin  patches to help control withdrawal symptoms. Quitting smoking will help your lungs heal faster.   Reduce the chances of another bout of acute bronchitis by washing your hands frequently, avoiding people with cold symptoms, and trying not to touch your hands to your mouth, nose, or eyes.   Follow up with your health care provider as directed.  SEEK MEDICAL CARE IF: Your symptoms do not improve after 1 week of treatment.  SEEK IMMEDIATE MEDICAL CARE IF:  You develop an increased fever or chills.   You have chest pain.   You have severe shortness of breath.  You have bloody sputum.   You develop dehydration.  You develop fainting.  You develop repeated vomiting.  You develop a severe headache. MAKE SURE YOU:   Understand these instructions.  Will watch your condition.  Will get help right away if you are not doing well or get worse. Document Released: 05/02/2004 Document Revised: 11/25/2012 Document Reviewed: 09/15/2012 [employer] Patient Information 2014 [employer].  

## 2013-07-09 NOTE — Progress Notes (Signed)
   Subjective:    Patient ID: Stephen Cannon, male    DOB: 04/23/1955, 58 y.o.   MRN: [id number]  HPI    Review of Systems     Objective:   Physical Exam        Assessment & Plan:

## 2013-07-13 ENCOUNTER — Ambulatory Visit (INDEPENDENT_AMBULATORY_CARE_PROVIDER_SITE_OTHER): Payer: BC Managed Care – PPO | Admitting: Family Medicine

## 2013-07-13 VITALS — BP 116/64 | HR 92 | Temp 98.3°F | Resp 18 | Ht 72.0 in | Wt 236.0 lb

## 2013-07-13 DIAGNOSIS — J209 Acute bronchitis, unspecified: Secondary | ICD-10-CM

## 2013-07-13 MED ORDER — LEVOFLOXACIN 500 MG PO TABS: 500.0000 mg | ORAL_TABLET | Freq: Every day | ORAL | Status: DC

## 2013-07-13 MED ORDER — HYDROCOD POLST-CHLORPHEN POLST 10-8 MG/5ML PO LQCR: 5.0000 mL | Freq: Two times a day (BID) | ORAL | Status: DC | PRN

## 2013-07-13 NOTE — Progress Notes (Signed)
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  He was seen 10 days ago and started on zithromax.  He seemed to improve at first, but now the cough is violent and deep but nonproductive.  No known fever. Both sons Clovis RileyMitchell and Ladona Ridgelaylor, as well as wife, are also sick.  Objective:  NAD, horrible cough  HEENT:  Redness with dull sheen on left TM Neck:  Supple Oroph:  Clear Chest:  Clear   Assessment:  Acute bronchitis - Plan: levofloxacin (LEVAQUIN) 500 MG tablet, chlorpheniramine-HYDROcodone (TUSSIONEX PENNKINETIC ER) 10-8 MG/5ML LQCR  Signed, Elvina SidleKurt Jonpaul Lumm, MD

## 2013-07-14 ENCOUNTER — Telehealth: Payer: Self-pay

## 2013-07-14 NOTE — Telephone Encounter (Signed)
Dr. Milus GlazierLauenstein asked patient to call back and give him an update. Patient now has a 101 degree fever and is experiencing some joint and back pain. Also coughing up yellow stuff. Please call wife Camelia Engerri at [phone] or [phone]

## 2013-09-10 ENCOUNTER — Other Ambulatory Visit: Payer: Self-pay | Admitting: Physician Assistant

## 2013-10-23 ENCOUNTER — Ambulatory Visit (INDEPENDENT_AMBULATORY_CARE_PROVIDER_SITE_OTHER): Payer: BC Managed Care – PPO | Admitting: Family Medicine

## 2013-10-23 VITALS — BP 120/70 | HR 81 | Temp 98.7°F | Resp 18 | Ht 71.2 in | Wt 233.0 lb

## 2013-10-23 DIAGNOSIS — Z024 Encounter for examination for driving license: Secondary | ICD-10-CM

## 2013-10-23 DIAGNOSIS — E785 Hyperlipidemia, unspecified: Secondary | ICD-10-CM

## 2013-10-23 DIAGNOSIS — Z0289 Encounter for other administrative examinations: Secondary | ICD-10-CM

## 2013-10-23 DIAGNOSIS — M2550 Pain in unspecified joint: Secondary | ICD-10-CM

## 2013-10-23 DIAGNOSIS — R3129 Other microscopic hematuria: Secondary | ICD-10-CM

## 2013-10-23 NOTE — Progress Notes (Signed)
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 He is healthy with no major complaints. He does have more aches and pains and it is hard for him to learn now. He has a history of high cholesterol. He was told he needs to come in sometime for check on that and labs. 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 He will do that tomorrow when he comes back in with his wife. No other major acute complaints.  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 He just wants to keep his card active. Married, has children.  Review of systems: Mild joint pains, otherwise unremarkable  Physical exam: Well-developed well-nourished man in no acute distress. TMs normal. Throat clear. Neck supple without nodes thyromegaly. Eyes PERRLA. Fundi benign. Chest clear. Heart regular without murmurs. Masses tenderness. No axillary or inguinal nodes. Normal male external genitalia testes descended. No hernias. Skin unremarkable. Pedal pulses good. Deep tendon reflexes symmetrical.  Assessment: Normal DOT examination History of hyperlipidemia Arthralgias Microscopic hematuria  Plan: Get his regular exam and and labral been seen. Will probably need to be referred to a urologist at that time for evaluation since he always has a little blood in his urine.

## 2013-10-24 ENCOUNTER — Ambulatory Visit (INDEPENDENT_AMBULATORY_CARE_PROVIDER_SITE_OTHER): Payer: BC Managed Care – PPO | Admitting: Emergency Medicine

## 2013-10-24 VITALS — BP 118/82 | HR 86 | Temp 98.5°F | Resp 16 | Ht 71.0 in | Wt 235.0 lb

## 2013-10-24 DIAGNOSIS — Z0000 Encounter for general adult medical examination without abnormal findings: Secondary | ICD-10-CM

## 2013-10-24 LAB — POCT URINALYSIS DIPSTICK
BILIRUBIN UA: NEGATIVE
GLUCOSE UA: NEGATIVE
KETONES UA: NEGATIVE
Leukocytes, UA: NEGATIVE
Nitrite, UA: NEGATIVE
RBC UA: NEGATIVE
SPEC GRAV UA: 1.015
Urobilinogen, UA: 0.2
pH, UA: 7

## 2013-10-24 LAB — COMPREHENSIVE METABOLIC PANEL
ALBUMIN: 4.2 g/dL (ref 3.5–5.2)
ALT: 23 U/L (ref 0–53)
AST: 18 U/L (ref 0–37)
Alkaline Phosphatase: 40 U/L (ref 39–117)
BUN: 19 mg/dL (ref 6–23)
CALCIUM: 9.1 mg/dL (ref 8.4–10.5)
CHLORIDE: 102 meq/L (ref 96–112)
CO2: 26 meq/L (ref 19–32)
Creat: 0.94 mg/dL (ref 0.50–1.35)
Glucose, Bld: 96 mg/dL (ref 70–99)
POTASSIUM: 4.5 meq/L (ref 3.5–5.3)
Sodium: 136 mEq/L (ref 135–145)
Total Bilirubin: 0.6 mg/dL (ref 0.2–1.2)
Total Protein: 6.6 g/dL (ref 6.0–8.3)

## 2013-10-24 LAB — POCT UA - MICROSCOPIC ONLY
Casts, Ur, LPF, POC: NEGATIVE
Crystals, Ur, HPF, POC: NEGATIVE
MUCUS UA: NEGATIVE
Yeast, UA: NEGATIVE

## 2013-10-24 LAB — LIPID PANEL
Cholesterol: 194 mg/dL (ref 0–200)
HDL: 52 mg/dL (ref >39)
LDL Cholesterol: 110 mg/dL — ABNORMAL HIGH (ref 0–99)
Total CHOL/HDL Ratio: 3.7 Ratio
Triglycerides: 158 mg/dL — ABNORMAL HIGH (ref <150)
VLDL: 32 mg/dL (ref 0–40)

## 2013-10-24 LAB — CBC
HCT: 46.4 % (ref 39.0–52.0)
Hemoglobin: 16.2 g/dL (ref 13.0–17.0)
MCH: 29.8 pg (ref 26.0–34.0)
MCHC: 34.9 g/dL (ref 30.0–36.0)
MCV: 85.3 fL (ref 78.0–100.0)
PLATELETS: 178 10*3/uL (ref 150–400)
RBC: 5.44 MIL/uL (ref 4.22–5.81)
RDW: 14.3 % (ref 11.5–15.5)
WBC: 4.8 10*3/uL (ref 4.0–10.5)

## 2013-10-24 LAB — TSH: TSH: 2.846 u[IU]/mL (ref 0.350–4.500)

## 2013-10-24 MED ORDER — SULFAMETHOXAZOLE-TMP DS 800-160 MG PO TABS: 1.0000 | ORAL_TABLET | Freq: Two times a day (BID) | ORAL | Status: DC

## 2013-10-24 NOTE — Progress Notes (Signed)
[facility] [address] [place] [address] [phone]  Date:  10/24/2013   Name:  Stephen Cannon   DOB:  [birth year]   MRN:  [id number]  PCP:  Tally Due, MD    Chief Complaint: Annual Exam   History of Present Illness:  Stephen Cannon is a 58 y.o. very pleasant male patient who presents with the following: Pravastatin for cholesterol.  Nonsmoker.  No other chronic medical concerns.  Noted to have blood in urine on DOT physical.  UTD colonoscopy.  Denies other complaint or health concern today.   Patient Active Problem List   Diagnosis Date Noted  . Allergic conjunctivitis 01/29/2012  . Hypercholesterolemia 08/01/2011    History reviewed. No pertinent past medical history.  Past Surgical History  Procedure Laterality Date  . Appendectomy      History  Substance Use Topics  . Smoking status: Never Smoker   . Smokeless tobacco: Never Used  . Alcohol Use: No    History reviewed. No pertinent family history.  No Known Allergies  Medication list has been reviewed and updated.  Current Outpatient Prescriptions on File Prior to Visit  Medication Sig Dispense Refill  . aspirin 81 MG tablet Take 81 mg by mouth daily.      . fluticasone (FLONASE) 50 MCG/ACT nasal spray Place 2 sprays into both nostrils daily.  16 g  6  . Multiple Vitamin (MULTIVITAMIN) tablet Take 1 tablet by mouth daily.      . pravastatin (PRAVACHOL) 40 MG tablet Take 1 tablet (40 mg total) by mouth at bedtime. Needs office visit  30 tablet  0   No current facility-administered medications on file prior to visit.    Review of Systems:  As per HPI, otherwise negative.    Physical Examination: Filed Vitals:   10/24/13 0818  BP: 118/82  Pulse: 86  Temp: 98.5 F (36.9 C)  Resp: 16   Filed Vitals:   10/24/13 0818  Height: 5\' 11"  (1.803 m)  Weight: 235 lb (106.595 kg)   Body mass index is 32.79 kg/(m^2). Ideal Body Weight: Weight in (lb) to have BMI = 25:  178.9  GEN: WDWN, NAD, Non-toxic, A & O x 3 HEENT: Atraumatic, Normocephalic. Neck supple. No masses, No LAD. Ears and Nose: No external deformity. CV: RRR, No M/G/R. No JVD. No thrill. No extra heart sounds. PULM: CTA B, no wheezes, crackles, rhonchi. No retractions. No resp. distress. No accessory muscle use. ABD: S, NT, ND, +BS. No rebound. No HSM. EXTR: No c/c/e NEURO Normal gait.  PSYCH: Normally interactive. Conversant. Not depressed or anxious appearing.  Calm demeanor.    Assessment and Plan: Wellness examination   Signed,  Phillips Odor, MD   Results for orders placed in visit on 10/24/13  POCT UA - MICROSCOPIC ONLY      Result Value Ref Range   WBC, Ur, HPF, POC 0-1     RBC, urine, microscopic 1-3     Bacteria, U Microscopic trace     Mucus, UA neg     Epithelial cells, urine per micros 0-1     Crystals, Ur, HPF, POC neg     Casts, Ur, LPF, POC neg     Yeast, UA neg    POCT URINALYSIS DIPSTICK      Result Value Ref Range   Color, UA yellow     Clarity, UA clear     Glucose, UA neg     Bilirubin, UA neg  Ketones, UA neg     Spec Grav, UA 1.015     Blood, UA neg     pH, UA 7.0     Protein, UA trace     Urobilinogen, UA 0.2     Nitrite, UA neg     Leukocytes, UA Negative

## 2013-10-25 LAB — PSA: PSA: 1.8 ng/mL (ref <=4.00)

## 2013-11-28 ENCOUNTER — Other Ambulatory Visit: Payer: Self-pay | Admitting: Physician Assistant

## 2013-12-11 ENCOUNTER — Ambulatory Visit (INDEPENDENT_AMBULATORY_CARE_PROVIDER_SITE_OTHER): Payer: BC Managed Care – PPO | Admitting: Emergency Medicine

## 2013-12-11 VITALS — BP 112/80 | HR 81 | Temp 97.6°F | Resp 16 | Ht 71.0 in | Wt 235.0 lb

## 2013-12-11 DIAGNOSIS — Z760 Encounter for issue of repeat prescription: Secondary | ICD-10-CM

## 2013-12-11 DIAGNOSIS — R319 Hematuria, unspecified: Secondary | ICD-10-CM

## 2013-12-11 DIAGNOSIS — E785 Hyperlipidemia, unspecified: Secondary | ICD-10-CM

## 2013-12-11 DIAGNOSIS — R6882 Decreased libido: Secondary | ICD-10-CM

## 2013-12-11 LAB — POCT URINALYSIS DIPSTICK
Bilirubin, UA: NEGATIVE
Glucose, UA: NEGATIVE
Ketones, UA: NEGATIVE
Leukocytes, UA: NEGATIVE
Nitrite, UA: NEGATIVE
Protein, UA: NEGATIVE
Spec Grav, UA: 1.01
Urobilinogen, UA: 0.2
pH, UA: 5.5

## 2013-12-11 LAB — POCT CBC
Granulocyte percent: 69.5 %G (ref 37–80)
HCT, POC: 47.8 % (ref 43.5–53.7)
Hemoglobin: 15.9 g/dL (ref 14.1–18.1)
LYMPH, POC: 1.3 (ref 0.6–3.4)
MCH: 29.9 pg (ref 27–31.2)
MCHC: 33.3 g/dL (ref 31.8–35.4)
MCV: 89.8 fL (ref 80–97)
MID (cbc): 0.5 (ref 0–0.9)
MPV: 8.2 fL (ref 0–99.8)
POC Granulocyte: 4 (ref 2–6.9)
POC LYMPH PERCENT: 22 %L (ref 10–50)
POC MID %: 8.5 %M (ref 0–12)
Platelet Count, POC: 178 10*3/uL (ref 142–424)
RBC: 5.32 M/uL (ref 4.69–6.13)
RDW, POC: 14 %
WBC: 5.8 10*3/uL (ref 4.6–10.2)

## 2013-12-11 LAB — TESTOSTERONE: TESTOSTERONE: 229 ng/dL — AB (ref 300–890)

## 2013-12-11 LAB — COMPREHENSIVE METABOLIC PANEL
ALBUMIN: 4.7 g/dL (ref 3.5–5.2)
ALT: 23 U/L (ref 0–53)
AST: 17 U/L (ref 0–37)
Alkaline Phosphatase: 41 U/L (ref 39–117)
BUN: 16 mg/dL (ref 6–23)
CO2: 26 meq/L (ref 19–32)
Calcium: 9.2 mg/dL (ref 8.4–10.5)
Chloride: 102 mEq/L (ref 96–112)
Creat: 0.95 mg/dL (ref 0.50–1.35)
GLUCOSE: 102 mg/dL — AB (ref 70–99)
POTASSIUM: 4.2 meq/L (ref 3.5–5.3)
SODIUM: 141 meq/L (ref 135–145)
Total Bilirubin: 0.5 mg/dL (ref 0.2–1.2)
Total Protein: 6.8 g/dL (ref 6.0–8.3)

## 2013-12-11 LAB — LIPID PANEL
Cholesterol: 224 mg/dL — ABNORMAL HIGH (ref 0–200)
HDL: 57 mg/dL (ref >39)
LDL CALC: 152 mg/dL — AB (ref 0–99)
Total CHOL/HDL Ratio: 3.9 Ratio
Triglycerides: 76 mg/dL (ref <150)
VLDL: 15 mg/dL (ref 0–40)

## 2013-12-11 LAB — IFOBT (OCCULT BLOOD): IMMUNOLOGICAL FECAL OCCULT BLOOD TEST: NEGATIVE

## 2013-12-11 MED ORDER — PRAVASTATIN SODIUM 40 MG PO TABS: ORAL_TABLET | ORAL | Status: DC

## 2013-12-11 NOTE — Progress Notes (Addendum)
Subjective:    Patient ID: Stephen Cannon, male    DOB: 10/30/1955, 58 y.o.   MRN: [id number]  This chart was scribed for Lesle Chris, MD by Jarvis Morgan, Medical Scribe. This patient was seen in Room 10 and the patient's care was started at 8:19 AM.  Chief Complaint  Patient presents with  . Medication Refill    Pravastatin  . Hematuria    Found during DOT exam in July    HPI HPI Comments: Stephen Cannon is a 58 y.o. male with a h/o hypercholesterolemia who presents to the [facility] for a medication refill for his Pravastatin medication. He is also here for a follow up of some hematuria that was found in his urine at his DOT exam in July. He admits he has never actually seen any blood in urine. He states that after he followed up with Dr. Dareen Piano who felt like he should get his prostate checked. He is a former smoker for 2 years, he quit in 1978. 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 Pt has no other complaints at this time.   Patient Active Problem List   Diagnosis Date Noted  . Allergic conjunctivitis 01/29/2012  . Hypercholesterolemia 08/01/2011   History reviewed. No pertinent past medical history. Past Surgical History  Procedure Laterality Date  . Appendectomy     No Known Allergies Prior to Admission medications   Medication Sig Start Date End Date Taking? Authorizing Provider  aspirin 81 MG tablet Take 81 mg by mouth daily.   Yes Historical Provider, MD  Multiple Vitamin (MULTIVITAMIN) tablet Take 1 tablet by mouth daily.   Yes Historical Provider, MD  pravastatin (PRAVACHOL) 40 MG tablet Take 1 tablet (40 mg total) by mouth at bedtime. Needs office visit   Yes Ryan M Dunn, PA-C  fluticasone [facility]) 50 MCG/ACT nasal spray Place 2 sprays into both nostrils daily. 05/30/13   Collene Gobble, MD   History   Social History  . Marital Status: Married    Spouse Name: N/A    Number of Children:  N/A  . Years of Education: N/A   Occupational History  . Not on file.   Social History Main Topics  . Smoking status: Never Smoker   . Smokeless tobacco: Never Used  . Alcohol Use: No  . Drug Use: No  . Sexual Activity: Yes   Other Topics Concern  . Not on file   Social History Narrative  . No narrative on file       Review of Systems  Genitourinary: Positive for hematuria.   patient states he has also had a issue with libido and fatigue and is concerned about his testosteronen Patient has also noticed some discoloration to his semen no true blood just a darkened semen     Objective:   Physical Exam CONSTITUTIONAL: Well developed/well nourished HEAD: Normocephalic/atraumatic EYES: EOMI/PERRL ENMT: Mucous membranes moist NECK: supple no meningeal signs SPINE:entire spine nontender CV: S1/S2 noted, no murmurs/rubs/gallops noted LUNGS: Lungs are clear to auscultation bilaterally, no apparent distress ABDOMEN: soft,  no rebound or guarding. Scar right lower abdomen. Minimal suprapubic tenderness. GU:no cva tenderness RECTAL: prostate is normal size without nodularity. Non tender NEURO: Pt is awake/alert, moves all extremitiesx4 EXTREMITIES: pulses normal, full ROM SKIN: warm, color normal PSYCH: no abnormalities of mood noted Results for orders placed in visit on 12/11/13  POCT CBC      Result Value  Ref Range   WBC 5.8  4.6 - 10.2 K/uL   Lymph, poc 1.3  0.6 - 3.4   POC LYMPH PERCENT 22.0  10 - 50 %L   MID (cbc) 0.5  0 - 0.9   POC MID % 8.5  0 - 12 %M   POC Granulocyte 4.0  2 - 6.9   Granulocyte percent 69.5  37 - 80 %G   RBC 5.32  4.69 - 6.13 M/uL   Hemoglobin 15.9  14.1 - 18.1 g/dL   HCT, POC 40.9  81.1 - 53.7 %   MCV 89.8  80 - 97 fL   MCH, POC 29.9  27 - 31.2 pg   MCHC 33.3  31.8 - 35.4 g/dL   RDW, POC 91.4     Platelet Count, POC 178  142 - 424 K/uL   MPV 8.2  0 - 99.8 fL  POCT URINALYSIS DIPSTICK      Result Value Ref Range   Color, UA yellow      Clarity, UA clear     Glucose, UA neg     Bilirubin, UA neg     Ketones, UA neg     Spec Grav, UA 1.010     Blood, UA trace     pH, UA 5.5     Protein, UA neg     Urobilinogen, UA 0.2     Nitrite, UA neg     Leukocytes, UA Negative    IFOBT (OCCULT BLOOD)      Result Value Ref Range   IFOBT Negative          Assessment & Plan:   we will proceed with CT abdomen stone protocol to be sure we are not missing something with the hematuria. He has no risk factors for renal or bladder cancer. His cholesterol medications were refilled. Patient advised to schedule full physical exam. We'll add a testosterone level to blood work at the lab. 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 Recent PSA and prostate exam are normal

## 2013-12-11 NOTE — Patient Instructions (Addendum)
Hematuria Hematuria is blood in your urine. It can be caused by a bladder infection, kidney infection, prostate infection, kidney stone, or cancer of your urinary tract. Infections can usually be treated with medicine, and a kidney stone usually will pass through your urine. If neither of these is the cause of your hematuria, further workup to find out the reason may be needed. It is very important that you tell your health care provider about any blood you see in your urine, even if the blood stops without treatment or happens without causing pain. Blood in your urine that happens and then stops and then happens again can be a symptom of a very serious condition. Also, pain is not a symptom in the initial stages of many urinary cancers. HOME CARE INSTRUCTIONS   Drink lots of fluid, 3-4 quarts a day. If you have been diagnosed with an infection, cranberry juice is especially recommended, in addition to large amounts of water.  Avoid caffeine, tea, and carbonated beverages because they tend to irritate the bladder.  Avoid alcohol because it may irritate the prostate.  Take all medicines as directed by your health care provider.  If you were prescribed an antibiotic medicine, finish it all even if you start to feel better.  If you have been diagnosed with a kidney stone, follow your health care provider's instructions regarding straining your urine to catch the stone.  Empty your bladder often. Avoid holding urine for long periods of time.  After a bowel movement, women should cleanse front to back. Use each tissue only once.  Empty your bladder before and after sexual intercourse if you are a male. SEEK MEDICAL CARE IF:  You develop back pain.  You have a fever.  You have a feeling of sickness in your stomach (nausea) or vomiting.  Your symptoms are not better in 3 days. Return sooner if you are getting worse. SEEK IMMEDIATE MEDICAL CARE IF:   You develop severe vomiting and are  unable to keep the medicine down.  You develop severe back or abdominal pain despite taking your medicines.  You begin passing a large amount of blood or clots in your urine.  You feel extremely weak or faint, or you pass out. MAKE SURE YOU:   Understand these instructions.  Will watch your condition.  Will get help right away if you are not doing well or get worse. Document Released: 03/25/2005 Document Revised: 08/09/2013 Document Reviewed: 11/23/2012 [facility] Patient Information 2015 [place], [place]. This information is not intended to replace advice given to you by your health care provider. Make sure you discuss any questions you have with your health care provider. High Cholesterol High cholesterol refers to having a high level of cholesterol in your blood. Cholesterol is a white, waxy, fat-like protein that your body needs in small amounts. Your liver makes all the cholesterol you need. Excess cholesterol comes from the food you eat. Cholesterol travels in your bloodstream through your blood vessels. If you have high cholesterol, deposits (plaque) may build up on the walls of your blood vessels. This makes the arteries narrower and stiffer. Plaque increases your risk of heart attack and stroke. Work with your health care provider to keep your cholesterol levels in a healthy range. RISK FACTORS Several things can make you more likely to have high cholesterol. These include:   Eating foods high in animal fat (saturated fat) or cholesterol.  Being overweight.  Not getting enough exercise.  Having a family history of high cholesterol.  SIGNS AND SYMPTOMS High cholesterol does not cause symptoms. DIAGNOSIS  Your health care provider can do a blood test to check whether you have high cholesterol. If you are older than 20, your health care provider may check your cholesterol every 4-6 years. You may be checked more often if you already have high cholesterol or other risk factors for  heart disease. The blood test for cholesterol measures the following:  Bad cholesterol (LDL cholesterol). This is the type of cholesterol that causes heart disease. This number should be less than 100.  Good cholesterol (HDL cholesterol). This type helps protect against heart disease. A healthy level of HDL cholesterol is 60 or higher.  Total cholesterol. This is the combined number of LDL cholesterol and HDL cholesterol. A healthy number is less than 200. TREATMENT  High cholesterol can be treated with diet changes, lifestyle changes, and medicine.   Diet changes may include eating more whole grains, fruits, vegetables, nuts, and fish. You may also have to cut back on red meat and foods with a lot of added sugar.  Lifestyle changes may include getting at least 40 minutes of aerobic exercise three times a week. Aerobic exercises include walking, biking, and swimming. Aerobic exercise along with a healthy diet can help you maintain a healthy weight. Lifestyle changes may also include quitting smoking.  If diet and lifestyle changes are not enough to lower your cholesterol, your health care provider may prescribe a statin medicine. This medicine has been shown to lower cholesterol and also lower the risk of heart disease. HOME CARE INSTRUCTIONS  Only take over-the-counter or prescription medicines as directed by your health care provider.   Follow a healthy diet as directed by your health care provider. For instance:   Eat chicken (without skin), fish, veal, shellfish, ground [place] breast, and round or loin cuts of red meat.  Do not eat fried foods and fatty meats, such as hot dogs and salami.   Eat plenty of fruits, such as apples.   Eat plenty of vegetables, such as broccoli, potatoes, and carrots.   Eat beans, peas, and lentils.   Eat grains, such as barley, rice, couscous, and bulgur wheat.   Eat pasta without cream sauces.   Use skim or nonfat milk and low-fat or nonfat  yogurt and cheeses. Do not eat or drink whole milk, cream, ice cream, egg yolks, and hard cheeses.   Do not eat stick margarine or tub margarines that contain trans fats (also called partially hydrogenated oils).   Do not eat cakes, cookies, crackers, or other baked goods that contain trans fats.   Do not eat saturated tropical oils, such as coconut and palm oil.   Exercise as directed by your health care provider. Increase your activity level with activities such as gardening or walking.   Keep all follow-up appointments.  SEEK MEDICAL CARE IF:  You are struggling to maintain a healthy diet or weight.  You need help starting an exercise program.  You need help to stop smoking. SEEK IMMEDIATE MEDICAL CARE IF:  You have chest pain.  You have trouble breathing. Document Released: 03/25/2005 Document Revised: 08/09/2013 Document Reviewed: 01/15/2013 [facility] Patient Information 2015 [place], [place]. This information is not intended to replace advice given to you by your health care provider. Make sure you discuss any questions you have with your health care provider.

## 2013-12-14 ENCOUNTER — Other Ambulatory Visit: Payer: Self-pay | Admitting: Emergency Medicine

## 2013-12-14 DIAGNOSIS — E291 Testicular hypofunction: Secondary | ICD-10-CM

## 2013-12-14 NOTE — Addendum Note (Signed)
Addended by: Johnnette Litter on: 12/14/2013 01:38 PM   Modules accepted: Orders

## 2013-12-24 ENCOUNTER — Ambulatory Visit
Admission: RE | Admit: 2013-12-24 | Discharge: 2013-12-24 | Disposition: A | Discharge status: Home / Self Care | Payer: BC Managed Care – PPO | Source: Ambulatory Visit | Attending: Emergency Medicine | Admitting: Emergency Medicine

## 2013-12-24 DIAGNOSIS — R319 Hematuria, unspecified: Secondary | ICD-10-CM

## 2013-12-28 ENCOUNTER — Encounter: Payer: BC Managed Care – PPO | Admitting: Emergency Medicine

## 2013-12-28 ENCOUNTER — Other Ambulatory Visit: Payer: Self-pay | Admitting: Radiology

## 2013-12-28 DIAGNOSIS — N400 Enlarged prostate without lower urinary tract symptoms: Secondary | ICD-10-CM

## 2013-12-28 NOTE — Progress Notes (Signed)
Patient scheduled for appointment on 12/1 15 at 245 with Dr. Cleta Alberts for a complete physical.

## 2013-12-31 ENCOUNTER — Telehealth: Payer: Self-pay

## 2013-12-31 NOTE — Telephone Encounter (Signed)
Pt has an appt with [facility]  On 01/31/14 at 1230 and would like to know if dr daub could call and get this moved up he is very anxious to schedule sooner

## 2014-01-03 ENCOUNTER — Other Ambulatory Visit: Payer: Self-pay | Admitting: Internal Medicine

## 2014-01-03 ENCOUNTER — Ambulatory Visit (INDEPENDENT_AMBULATORY_CARE_PROVIDER_SITE_OTHER): Payer: BC Managed Care – PPO | Admitting: Internal Medicine

## 2014-01-03 ENCOUNTER — Encounter: Payer: Self-pay | Admitting: Internal Medicine

## 2014-01-03 VITALS — BP 134/86 | HR 68 | Temp 98.0°F | Resp 14 | Ht 71.0 in | Wt 242.0 lb

## 2014-01-03 DIAGNOSIS — R7989 Other specified abnormal findings of blood chemistry: Secondary | ICD-10-CM

## 2014-01-03 DIAGNOSIS — E291 Testicular hypofunction: Secondary | ICD-10-CM

## 2014-01-03 NOTE — Telephone Encounter (Signed)
Referrals made an appt for this pt for this week and pt and Dr. Cleta Alberts notified.

## 2014-01-03 NOTE — Telephone Encounter (Signed)
Patient and let him know I will call [facility] today to see if we can get a sooner Appt.

## 2014-01-03 NOTE — Progress Notes (Addendum)
Patient ID: Stephen Cannon, male   DOB: Apr 01, 1956, 58 y.o.   MRN: [id number]  HPI: Stephen Cannon is a 58 y.o.-year-old man, referred by his PCP, Dr. Cleta Alberts, in consultation for low testosterone.  He saw PCP for a physical >> urine sample >> hematuria (which he had 1 year before) >> tx with ABx >> U/A repeated in 6 weeks >> still hematuria >> CT abdomen >> enlarged prostate and bladder thickening >> has appt with urology (Dr Janifer Adie).  He also c/o fatigue, low energy. Sometimes when he wakes up unrefreshed, he also snores. He saw ENT for congestion in his L maxillary sinus >> ABx >> did not help.  Reviewed pertinent labs: Component     Latest Ref Rng 12/11/2013  Testosterone     300 - 890 ng/dL 409 (L)  Drawn at 9 am.  Pt denies decreased libido Has difficulty obtaining/maintaining an erection No trauma to testes, testicular irradiation or surgery, no STDs No h/o of mumps orchitis/h/o autoimmune ds. No h/o cryptorchidism He grew and went through puberty like his peers Possible shrinking of testes. No very small testes (<5 ml) No incomplete/delayed sexual development     No breast discomfort/gynecomastia    No loss of body hair (axillary/pubic)/decreased need for shaving No height loss No abnormal sense of smell (only allergies) No hot flushes No vision problems No worst HA of his life No FH of hypogonadism/infertility  No personal h/o infertility - has 2 children No FH of hemochromatosis or pituitary tumors No excessive weight gain or loss.  No chronic diseases No chronic pain. Not on opiates, does not take steroids.  No more than 2 drinks a day of alcohol at a time, and this is rarely No anabolic steroids use No herbal medicines Not on antidepressants  No AI ds in his family, no FH of MS. He does not have family history of early cardiac disease.  He has low back pain, bilateral hip pain esp. when driving.   Meals: - B: cereal bar - L: sandwich - D: salad, veggies,  meat - snack: sometimes; 1  Last TSH: Lab Results  Component Value Date   TSH 2.846 10/24/2013   Has HL - on Pravachol usually, but off for 3 weeks: Lab Results  Component Value Date   CHOL 224* 12/11/2013   HDL 57 12/11/2013   LDLCALC 811* 12/11/2013   TRIG 76 12/11/2013   CHOLHDL 3.9 12/11/2013    ROS: Constitutional: + weight gain, + fatigue, no subjective hyperthermia/hypothermia, + increased urination, + nocturia, + poor sleep Eyes: no blurry vision, no xerophthalmia ENT: no sore throat, no nodules palpated in throat, no dysphagia/odynophagia, no hoarseness; + hypoacusis, + tinnitus Cardiovascular: no CP/SOB/palpitations/leg swelling Respiratory: no cough/SOB Gastrointestinal: no N/V/D/C Musculoskeletal: no muscle/+ joint aches Skin: no rashes Neurological: no tremors/numbness/tingling/dizziness Psychiatric: no depression/anxiety + diff with erections  PMH: - HL  Past Surgical History  Procedure Laterality Date  . Appendectomy     History   Social History  . Marital Status: Married    Spouse Name: N/A    Number of Children: 2: 56 and 45 y/o   Occupational History  . [employer]   Social History Main Topics  . Smoking status: Smoked since 1977 to early 1980s  . Smokeless tobacco: Never Used  . Alcohol Use: No  . Drug Use: No  . Sexual Activity: Yes   Current Outpatient Prescriptions on File Prior to Visit  Medication Sig Dispense Refill  . aspirin 81 MG tablet  Take 81 mg by mouth daily.      . Multiple Vitamin (MULTIVITAMIN) tablet Take 1 tablet by mouth daily.      . pravastatin (PRAVACHOL) 40 MG tablet Take 1 tablet at night  30 tablet  11  . fluticasone (FLONASE) 50 MCG/ACT nasal spray Place 2 sprays into both nostrils daily.  16 g  6   No current facility-administered medications on file prior to visit.   NKDA  FH: - HTN in mother - HL in mother and father - CHF in father - dementia in father  PE: BP 134/86  Pulse 68  Temp(Src) 98 F (36.7 C)  Resp  14  Ht  (1.803 m)  Wt 242 lb (109.77 kg)  BMI 33.77 kg/m2  SpO2 95% Wt Readings from Last 3 Encounters:  01/03/14 242 lb (109.77 kg)  12/11/13 235 lb (106.595 kg)  10/24/13 235 lb (106.595 kg)   Constitutional: overweight, in NAD Eyes: PERRLA, EOMI, no exophthalmos ENT: moist mucous membranes, no thyromegaly, no cervical lymphadenopathy, ; Mallampati 3 Cardiovascular: RRR, No MRG Respiratory: CTA B Gastrointestinal: abdomen soft, NT, ND, BS+ Musculoskeletal: no deformities, strength intact in all 4 Skin: moist, warm, no rashes Neurological: mild tremor with outstretched hands, DTR normal in all 4 Genital exam: normal male escutcheon, no inguinal LAD, normal phallus, testes ~25 mL, no testicular masses, no penile discharge.  No gynecomastia.  ASSESSMENT: 1. Low testosterone  PLAN:  1. Low total testosterone I had a long discussion with the pt regarding his testosterone results, which we reviewed together. The total testosterone consists of free T and bound testosterone. The total testosterone can be low: - later in the afternoon  - when the testes do not secrete enough testosterone (in this case the free testosterone is low, too)   - when the SHBG is low (in this case the free testosterone is normal) - Many times, the cause for slightly low testosterone is improving his sleep, diet and exercise. Since he is snoring and has to sleep with the [facility] elevated to snore less >> I suggested to first check if her has OSA >> he will d/w Dr Cleta Alberts to have a sleep study. Improvement in his breathing (e.g. CPAP) can improve testosterone levels. He does not drink alcohol, smoke or use drugs and he does not appear to have a very poor diet.  - he also has to see urology to have a DRE and see how enlarged is his prostate, as this can be an impediment to testosterone tx. Of note, he had a normal PSA recently.  - in the meantime, I would also want to check if there are any endocrine causes for his  low T >> will check: Orders Placed This Encounter  Procedures  . Testosterone, free, total  . LH  . FSH  . Prolactin  . Insulin-like growth factor  . Cortisol  . ACTH  . IBC panel  . Hemoglobin A1C  . Estradiol  . TSH  . T4, free  . T3, free  . hCG, quantitative, pregnancy  - As testosterone is a controlled substance, per guidelines, is only recommended for pts with clearly low testosterone. If all the investigation is negative except for his low T >> we can start Androgel. He agrees to plan.  All labs were normal, including the testosterone, so no testosterone replacement is needed for now. We can cancel the appt in 3 months but he still needs the sleep study as this may contribute to his  fatigue (he will d/w his PCP about it) and he can have testosterone levels checked every year by PCP (fasting, at 8 am, including free testosterone) and come back if they are low.   Letter will be sent and also pt will be called:  January 05, 2014   Stephen Cannon [address] [place] [address]  Dear Mr. Jankovich,  Below are the results from your recent visit - they are all normal, including the testosterone:  IRON AND TIBC      Result Value Ref Range   Iron 72  42 - 165 ug/dL   UIBC 409  811 - 914 ug/dL   TIBC 782  956 - 213 ug/dL   %SAT 21  20 - 55 %   Narrative:    Performed at:  [facility]                [address]                [place], [place] [address] FASTING  TESTOSTERONE, FREE, TOTAL      Result Value Ref Range   Testosterone 353  300 - 890 ng/dL   Sex Hormone Binding 24  13 - 71 nmol/L   Testosterone, Free 83.9  47.0 - 244.0 pg/mL   Testosterone-% Free 2.4  1.6 - 2.9 %   Narrative:    Performed at:  [facility]                [address]                [place], [place] [address]  TSH      Result Value Ref Range   TSH 3.318  0.350 - 4.500 uIU/mL   Narrative:    Performed at:  [facility]                 [address], [address]                [place], [place] [address]  T4, FREE      Result Value Ref Range   Free T4 1.21  0.80 - 1.80 ng/dL   Narrative:    Performed at:  [facility]                [address]                [place], [place] [address]  HEMOGLOBIN A1C      Result Value Ref Range   Hemoglobin A1C 5.6  <5.7 %   Mean Plasma Glucose 114  <117 mg/dL   Narrative:    Performed at:  [facility]                [address]                [place], [place] [address]  FOLLICLE STIMULATING HORMONE      Result Value Ref Range   FSH 3.2  1.4 - 18.1 mIU/mL   Narrative:    Performed at:  [facility]                [address]                [place], [place] [address]  LUTEINIZING HORMONE      Result Value Ref Range   LH 1.9  1.5 - 9.3 mIU/mL  Narrative:    Performed at:  [facility]                [address], [address]                [place], [place] [address]  PROLACTIN      Result Value Ref Range   Prolactin 2.8  2.1 - 17.1 ng/mL   Narrative:    Performed at:  [facility]                [address], [address]                [place], [place] [address]  CORTISOL      Result Value Ref Range   Cortisol, Plasma 14.1     Narrative:    Performed at:  [facility]                [address]                [place], [place] [address]  HCG, QUANTITATIVE, PREGNANCY      Result Value Ref Range   hCG, Beta Chain, Quant, S 0.0     Narrative:    Performed at:  [facility]                [address]                [place], [place] [address]  T3, FREE      Result Value Ref Range   T3, Free 3.4  2.3 - 4.2 pg/mL   Narrative:    Performed at:  [facility]                [address], [address]                [place], [place] [address]   If you have any questions or concerns, please don't hesitate to call.  Sincerely, Carlus Pavlov, MD  PhD [facility] [address] [place], [place] [address] Tel. [phone] Fax [phone]   Addendum: 03/17/2014 Component     Latest Ref Rng 01/03/2014  Estradiol, Free      0.52 H  Estradiol      22  C206 ACTH     6 - 50 pg/mL 27  Somatomedin (IGF-I)     35 - 196 ng/mL 161  Estradiol slightly high, which is not unusual in obese. Rest of the pituitary labs are normal.

## 2014-01-03 NOTE — Patient Instructions (Signed)
Please stop at [facility] lab downstairs. Please discuss with Dr Cleta Alberts to get a sleep study. Please come back for a follow-up appointment in 4 months.

## 2014-01-04 LAB — TESTOSTERONE, FREE, TOTAL, SHBG
Sex Hormone Binding: 24 nmol/L (ref 13–71)
TESTOSTERONE-% FREE: 2.4 % (ref 1.6–2.9)
TESTOSTERONE: 353 ng/dL (ref 300–890)
Testosterone, Free: 83.9 pg/mL (ref 47.0–244.0)

## 2014-01-04 LAB — FOLLICLE STIMULATING HORMONE: FSH: 3.2 m[IU]/mL (ref 1.4–18.1)

## 2014-01-04 LAB — IRON AND TIBC
%SAT: 21 % (ref 20–55)
Iron: 72 ug/dL (ref 42–165)
TIBC: 342 ug/dL (ref 215–435)
UIBC: 270 ug/dL (ref 125–400)

## 2014-01-04 LAB — LUTEINIZING HORMONE: LH: 1.9 m[IU]/mL (ref 1.5–9.3)

## 2014-01-04 LAB — HCG, QUANTITATIVE, PREGNANCY: HCG, BETA CHAIN, QUANT, S: 0 m[IU]/mL

## 2014-01-04 LAB — HEMOGLOBIN A1C
Hgb A1c MFr Bld: 5.6 % (ref <5.7)
Mean Plasma Glucose: 114 mg/dL (ref <117)

## 2014-01-04 LAB — INSULIN-LIKE GROWTH FACTOR: Somatomedin (IGF-I): 161 ng/mL (ref 35–196)

## 2014-01-04 LAB — T3, FREE: T3, Free: 3.4 pg/mL (ref 2.3–4.2)

## 2014-01-04 LAB — CORTISOL: Cortisol, Plasma: 14.1 ug/dL

## 2014-01-04 LAB — T4, FREE: Free T4: 1.21 ng/dL (ref 0.80–1.80)

## 2014-01-04 LAB — PROLACTIN: Prolactin: 2.8 ng/mL (ref 2.1–17.1)

## 2014-01-04 LAB — TSH: TSH: 3.318 u[IU]/mL (ref 0.350–4.500)

## 2014-01-05 ENCOUNTER — Telehealth: Payer: Self-pay | Admitting: *Deleted

## 2014-01-05 ENCOUNTER — Encounter: Payer: Self-pay | Admitting: Internal Medicine

## 2014-01-05 LAB — ACTH: C206 ACTH: 27 pg/mL (ref 6–50)

## 2014-01-05 NOTE — Telephone Encounter (Signed)
Called pt and lvm advising him per Dr Charlean SanfilippoGherghe's result note: his labs were normal, including the testosterone so no testosterone replacement is needed for now. We can cancel the appt in 3 months but he still needs the sleep study as this may contribute to his fatigue (he will d/w his PCP about it) and he can have testosterone levels checked every year by PCP (fasting, at 8 am) and come back if they are low. Advised pt to call if he had any further questions. Letter mailed today as well.

## 2014-01-06 NOTE — Progress Notes (Signed)
This encounter was created in error - please disregard.

## 2014-03-08 ENCOUNTER — Encounter: Payer: Self-pay | Admitting: Emergency Medicine

## 2014-03-17 LAB — ESTRADIOL, FREE
Estradiol, Free: 0.52
Estradiol: 22

## 2014-05-04 ENCOUNTER — Other Ambulatory Visit: Payer: Self-pay | Admitting: Emergency Medicine

## 2014-05-04 ENCOUNTER — Telehealth: Payer: Self-pay | Admitting: Emergency Medicine

## 2014-05-04 DIAGNOSIS — G4733 Obstructive sleep apnea (adult) (pediatric): Secondary | ICD-10-CM

## 2014-05-04 NOTE — Telephone Encounter (Signed)
Called pt, LMOM referral placed.

## 2014-05-04 NOTE — Telephone Encounter (Signed)
Can we refer again Dr. Cleta Albertsaub?

## 2014-05-04 NOTE — Telephone Encounter (Signed)
Patient states that in 2014 he had a referral for a sleep study. Patient wants to have another one done however [facility]. Patient's last OV with Dr. Cleta Albertsaub was 12/11/2013.   [phone]

## 2014-05-04 NOTE — Telephone Encounter (Signed)
Call patient and let him know I put in the referral

## 2014-05-05 ENCOUNTER — Ambulatory Visit: Payer: BC Managed Care – PPO | Admitting: Internal Medicine

## 2014-05-17 ENCOUNTER — Ambulatory Visit (INDEPENDENT_AMBULATORY_CARE_PROVIDER_SITE_OTHER): Payer: BC Managed Care – PPO | Admitting: Neurology

## 2014-05-17 ENCOUNTER — Encounter: Payer: Self-pay | Admitting: Neurology

## 2014-05-17 VITALS — BP 121/75 | HR 68 | Resp 18 | Ht 71.75 in | Wt 239.0 lb

## 2014-05-17 DIAGNOSIS — R0683 Snoring: Secondary | ICD-10-CM

## 2014-05-17 DIAGNOSIS — G4730 Sleep apnea, unspecified: Secondary | ICD-10-CM

## 2014-05-17 NOTE — Progress Notes (Signed)
SLEEP MEDICINE CLINIC   Provider:  Melvyn Novas, M D  Referring Provider: Jonita Albee, MD Primary Care Physician:  Lucilla Edin, MD  Chief Complaint  Patient presents with  . NP Daub Sleep Consult    Rm 10. Alone    HPI:  Stephen Cannon is a 59 y.o. male , seen here as a referral  from Dr. Perrin Maltese for a sleep apnea evaluation.  Mr. Camper reports that his wife has witnessed him to snore loudly and to also breathes irregularly at night she is concerned about what seems to be obvious sleep apnea. He also has a mild retro-nasally her. His voice is changed -he speaks more " nasal "and has actually a lot of trouble breathing through the nose.  He has just seen an ear nose and throat specialist, Dr. Pollyann Kennedy, at [facility]. Dr. Pollyann Kennedy also wanted the patient to be further evaluated for sleep apnea before any surgical measures would be taken. He has at this time  recommended a medical treatment by antibiotic for his sinusitis. He also used a nasal spray Flonase/  Nasacort. The patient reported this seems to help, but soon after he finished the course , his rhinitis came back. He has not had any comments form his dentist, not known to have TMJ or bruxism.  The patient sees Dr Dione Booze, who made him aware that snoring may contribute to same facial changes, he has baggy eyes.    The patient's wife also made and additionally important observation her husband snores when he sleeps supine or sleeps on his right side but he doesn't seem to snore nearly as loud if he sleeps on his left side or if he uses a wedge or pillow to prop up his head.  He goes to bed around 10 PM he shares a bedroom with his wife. The bedroom is described as core, quiet and dark. He usually falls asleep the moment he gets a pillow. He will have to bathroom breaks interrupting his sleep at night, he wakes up in the morning at 5:30. Often he is spontaneously awake before the alarm rings. He gets a good 7 hours of sleep and is  usually restored in the morning he does not suffer from morning HA and he has a dry mouth. He drinks caffeine in the morning, 2 cups of coffee. Most of the time he drinks water during the day sometimes he may drink unsweetened iced tea. He is not a soda drinker at all. He doesn't nap in daytime. He fights sleepiness after lunchtime.   He has some back pain, chronically , and was told he has DDD.  He has no shift work history . He loves to work in his yard, he walks for exercise.  There is a family history of snoring, paternal, but no apnea was ever diagnosed.      Review of Systems: Out of a complete 14 system review, the patient complains of only the following symptoms, and all other reviewed systems are negative. On the review of systems the patient endorsed joint pain, sometimes difficulty swallowing sometimes during the night feeling of choking, snoring loudly, and ringing in his ears. He also suffers from glaucoma, hypercholesterolemia, and ethmoidal sinusitis.  Epworth score 4 points , Fatigue severity score 13  , depression score :0   History   Social History  . Marital Status: Married    Spouse Name: N/A    Number of Children: N/A  . Years of Education: N/A  Occupational History  . Not on file.   Social History Main Topics  . Smoking status: Former Smoker    Quit date: 04/08/1981  . Smokeless tobacco: Never Used  . Alcohol Use: No  . Drug Use: No  . Sexual Activity: Yes   Other Topics Concern  . Not on file   Social History Narrative   Caffeine 2 cups daily avg.    History reviewed. No pertinent family history.  History reviewed. No pertinent past medical history.  Past Surgical History  Procedure Laterality Date  . Appendectomy      Current Outpatient Prescriptions  Medication Sig Dispense Refill  . aspirin 81 MG tablet Take 81 mg by mouth daily.    Marland Kitchen. co-enzyme Q-10 30 MG capsule Take 30 mg by mouth daily.     Marland Kitchen. glucosamine-chondroitin 500-400 MG  tablet Take 1 tablet by mouth 2 (two) times daily.    . Multiple Vitamin (MULTIVITAMIN) tablet Take 1 tablet by mouth daily.    . pravastatin (PRAVACHOL) 40 MG tablet Take 1 tablet at night 30 tablet 11  . UNABLE TO FIND Take 1 capsule by mouth. Med Name:Tumeric cap    . fluticasone (FLONASE) 50 MCG/ACT nasal spray Place 2 sprays into both nostrils daily. (Patient not taking: Reported on 05/17/2014) 16 g 6   No current facility-administered medications for this visit.    Allergies as of 05/17/2014  . (No Known Allergies)    Vitals: BP 121/75 mmHg  Pulse 68  Resp 18  Ht 5' 11.75" (1.822 m)  Wt 239 lb (108.41 kg)  BMI 32.66 kg/m2 Last Weight:  Wt Readings from Last 1 Encounters:  05/17/14 239 lb (108.41 kg)       Last Height:   Ht Readings from Last 1 Encounters:  05/17/14 5' 11.75" (1.822 m)    Physical exam:  General: The patient is awake, alert and appears not in acute distress. The patient is well groomed. Head: Normocephalic, atraumatic. Neck is supple. Mallampati 4/5    neck circumference: 18". Nasal airflow restricted , TMJ is  Not  evident . Retrognathia is seen.  Cardiovascular:  Regular rate and rhythm, without  murmurs or carotid bruit, and without distended neck veins. Respiratory: Lungs are clear to auscultation. Skin:  Without evidence of edema, or rash Trunk: BMI is elevated and patient  has normal posture.  Neurologic exam : The patient is awake and alert, oriented to place and time.   Memory subjective  described as intact.  There is a normal attention span & concentration ability. Speech is fluent with Nasal dysphonia  Mood and affect are appropriate.  Cranial nerves: Pupils are equal and briskly reactive to light. Funduscopic exam without evidence of pallor or edema.  Floaters in the left eye.  Extraocular movements  in vertical and horizontal planes intact and without nystagmus. Visual fields by finger perimetry are intact. Hearing to finger rub intact.  Facial sensation intact to fine touch.  Facial motor strength is symmetric and tongue and uvula move midline.  Motor exam:   Normal tone, muscle bulk and symmetric strength in all extremities.  Sensory:  Fine touch, pinprick and vibration were tested in all extremities. Proprioception is normal.  Coordination: Rapid alternating movements in the fingers/hands is normal.  Finger-to-nose maneuver  without evidence of ataxia, dysmetria and mild action  tremor.  Gait and station: Patient walks without assistive device and is able unassisted to climb up to the exam table.  Strength within normal limits. Stance  is stable and normal. Tandem gait is unfragmented. Romberg testing is  negative.  Deep tendon reflexes: in the  upper and lower extremities are symmetric and intact. Babinski maneuver response is  downgoing.   Assessment:  After physical and neurologic examination, review of laboratory studies, imaging, neurophysiology testing and pre-existing records, assessment is  1) patient with a history of uveitis and iritis.  No dry mouth , but chronical nasal congestion.  Some ciliary injection- this patient should not be fitted with a FFM in case he qualifies for CPAP>  The patient has chronic ethmoidal sinusitis which was recently successfully treated with antibiotics but he still has nasal congestion.   2)He also has retrognathia and a very high-grade Mallampati, BMI :  Plus  , his low uvula is likely responsible for his snoring habit.   I think he is at a high risk of having sleep apnea and should be tested in a split-night polysomnography. I explained to him that weight loss also will help to reduce apnea sometimes cures apnea. I don't think that weight loss will change a lot of his upper airway anatomy. Given that he has retrognathia he may respond to a dental mandibular advancement device rather than considering CPAP alone for therapy. This will depend on the character of apnea the degree of  apnea and if oxygen desaturation is associated or not.    The patient was advised of the nature of the diagnosed sleep disorder , the treatment options and risks for general a health and wellness arising from not treating the condition.  Visit duration was 40  minutes. 50 %of our face to face time was spent in discussion of sleep apnea types, and of treatment options.   Plan:  Treatment plan and additional workup :  SPLIT , AHI 15 and score 3% . Rv for 30 minutes.    Porfirio Mylar Skylin Kennerson MD  05/17/2014

## 2014-05-17 NOTE — Patient Instructions (Signed)
Polysomnography (Sleep Studies) Polysomnography (PSG) is a series of tests used for detecting (diagnosing) obstructive sleep apnea and other sleep disorders. The tests measure how some parts of your body are working while you are sleeping. The tests are extensive and expensive. They are done in a sleep lab or hospital, and vary from center to center. Your caregiver may perform other more simple sleep studies and questionnaires before doing more complete and involved testing. Testing may not be covered by insurance. Some of these tests are:  An EEG (Electroencephalogram). This tests your brain waves and stages of sleep.  An EOG (Electrooculogram). This measures the movements of your eyes. It detects periods of REM (rapid eye movement) sleep, which is your dream sleep.  An EKG (Electrocardiogram). This measures your heart rhythm.  EMG (Electromyography). This is a measurement of how the muscles are working in your upper airway and your legs while sleeping.  An oximetry measurement. It measures how much oxygen (air) you are getting while sleeping.  Breathing efforts may be measured. The same test can be interpreted (understood) differently by different caregivers and centers that study sleep.  Studies may be given an apnea/hypopnea index (AHI). This is a number which is found by counting the times of no breathing or under breathing during the night, and relating those numbers to the amount of time spent in bed. When the AHI is greater than 15, the patient is likely to complain of daytime sleepiness. When the AHI is greater than 30, the patient is at increased risk for heart problems and must be followed more closely. Following the AHI also allows you to know how treatment is working. Simple oximetry (tracking the amount of oxygen that is taken in) can be used for screening patients who:  Do not have symptoms (problems) of OSA.  Have a normal Epworth Sleepiness Scale Score.  Have a low pre-test  probability of having OSA.  Have none of the upper airway problems likely to cause apnea.  Oximetry is also used to determine if treatment is effective in patients who showed significant desaturations (not getting enough oxygen) on their home sleep study. One extra measure of safety is to perform additional studies for the person who only snores. This is because no one can predict with absolute certainty who will have OSA. Those who show significant desaturations (not getting enough oxygen) are recommended to have a more detailed sleep study. Document Released: 09/29/2002 Document Revised: 06/17/2011 Document Reviewed: 05/31/2013 Exit[employer] Patient Information 2015 [employer]. This information is not intended to replace advice given to you by your health care provider. Make sure you discuss any questions you have with your health care provider.  

## 2014-05-20 ENCOUNTER — Ambulatory Visit (INDEPENDENT_AMBULATORY_CARE_PROVIDER_SITE_OTHER): Payer: BC Managed Care – PPO | Admitting: Neurology

## 2014-05-20 VITALS — BP 108/61 | HR 58

## 2014-05-20 DIAGNOSIS — G4730 Sleep apnea, unspecified: Secondary | ICD-10-CM

## 2014-05-20 DIAGNOSIS — R0683 Snoring: Secondary | ICD-10-CM

## 2014-05-21 NOTE — Sleep Study (Signed)
Please see the scanned sleep study interpretation located in the Procedure tab within the Chart Review section. 

## 2014-05-31 DIAGNOSIS — G4730 Sleep apnea, unspecified: Secondary | ICD-10-CM | POA: Insufficient documentation

## 2014-05-31 DIAGNOSIS — R0683 Snoring: Secondary | ICD-10-CM | POA: Insufficient documentation

## 2014-06-01 ENCOUNTER — Telehealth: Payer: Self-pay | Admitting: *Deleted

## 2014-06-01 ENCOUNTER — Encounter: Payer: Self-pay | Admitting: Neurology

## 2014-06-01 NOTE — Telephone Encounter (Signed)
Patient was contacted and provided the results of his sleep study which revealed only mild OSA.  Patient was informed that Dr. Vickey Hugerohmeier had recommended a follow up appointment to go over the results.  Patient was in agreement and scheduled his sleep study for 06/15/14 at 3:15 pm.  Dr. Lesle ChrisSteven Daub was faxed a copy of the report.

## 2014-06-15 ENCOUNTER — Ambulatory Visit: Payer: BC Managed Care – PPO | Admitting: Neurology

## 2014-06-27 ENCOUNTER — Encounter: Payer: Self-pay | Admitting: Neurology

## 2014-06-27 ENCOUNTER — Ambulatory Visit (INDEPENDENT_AMBULATORY_CARE_PROVIDER_SITE_OTHER): Payer: BC Managed Care – PPO | Admitting: Neurology

## 2014-06-27 VITALS — BP 121/75 | HR 67 | Resp 12 | Ht 72.0 in | Wt 236.4 lb

## 2014-06-27 DIAGNOSIS — M2619 Other specified anomalies of jaw-cranial base relationship: Secondary | ICD-10-CM | POA: Diagnosis not present

## 2014-06-27 DIAGNOSIS — R0683 Snoring: Secondary | ICD-10-CM

## 2014-06-27 DIAGNOSIS — J301 Allergic rhinitis due to pollen: Secondary | ICD-10-CM | POA: Diagnosis not present

## 2014-06-27 MED ORDER — TRIAMCINOLONE ACETONIDE 55 MCG/ACT NA AERO: 2.0000 | INHALATION_SPRAY | Freq: Every day | NASAL | Status: DC

## 2014-06-27 NOTE — Progress Notes (Signed)
SLEEP MEDICINE CLINIC   Provider:  Melvyn Novas, M D  Referring Provider: Collene Gobble, MD Primary Care Physician:  Lucilla Edin, MD  Chief Complaint  Patient presents with  . RV sleep results    Rm 11, alone    HPI:  Stephen Cannon is a 59 y.o. male , was seen here in February2016 seen here as a referral  from Dr. Cleta Alberts for a sleep apnea evaluation.  Mr. Ress reports that his wife has witnessed him to snore loudly and to also breathes irregularly at night she is concerned about what seems to be obvious sleep apnea. He also has a mild retro-nasally her. His voice is changed -he speaks more " nasal "and has actually a lot of trouble breathing through the nose.  He has just seen an ear nose and throat specialist, Dr. Pollyann Kennedy, at [facility]. Dr. Pollyann Kennedy also wanted the patient to be further evaluated for sleep apnea before any surgical measures would be taken. He has at this time  recommended a medical treatment by antibiotic for his sinusitis. He also used a nasal spray Flonase/  Nasacort. The patient reported this seems to help, but soon after he finished the course , his rhinitis came back. He has not had any comments form his dentist, not known to have TMJ or bruxism.  The patient sees Dr Dione Booze, who made him aware that snoring may contribute to same facial changes, he has baggy eyes.  The patient's wife also made and additionally important observation her husband snores when he sleeps supine or sleeps on his right side but he doesn't seem to snore nearly as loud if he sleeps on his left side or if he uses a wedge or pillow to prop up his head.  He goes to bed around 10 PM he shares a bedroom with his wife. The bedroom is described as core, quiet and dark. He usually falls asleep the moment he gets a pillow. He will have to bathroom breaks interrupting his sleep at night, he wakes up in the morning at 5:30. Often he is spontaneously awake before the alarm rings. He gets a good 7  hours of sleep and is usually restored in the morning he does not suffer from morning HA and he has a dry mouth. He drinks caffeine in the morning, 2 cups of coffee. Most of the time he drinks water during the day sometimes he may drink unsweetened iced tea. He is not a soda drinker at all. He doesn't nap in daytime. He fights sleepiness after lunchtime.   He has some back pain, chronically , and was told he has DDD.  He has no shift work history . He loves to work in his yard, he walks for exercise.  There is a family history of snoring, paternal, but no apnea was ever diagnosed.   Interval history . 06-27-14  Mr. Inclan returns today for follow-up visit after polysomnography. His test to place on 05-20-14 and the patient scored an apnea hypotony index of only 5.7 but his RDI the so-called respiratory disturbance index which includes arousals from snoring was 9.4. I explained that apnea is a cessation of breathing while a hypotony is shallow breathing. Snoring doesn't need to limit the amplitude of breathing but it often leads to arousals out of sleep and a reduction in the overall deeper sleep stage sleep. The patient slept mostly in the nonsupine position. He had no significant periodic limb movements and his average heart rate  was in normal sinus rhythm at 58 bpm. 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 Treatment by a dental device or by an ENT procedure is advised and weight loss of course is advised he already sleeps on his side by habit so that needs to be no further positional changes made. I explained to him that it would also help to keep the nasal passage open and I would recommend to use Nasonex or Nasacort (but not a decongestant ) to allow  nasal breathing overnight. I believe that both applications are available generic now  ..  I also explained the dental devices. Usually those anchor to the maxillary jaw the upper teeth. This anchor point is needed to move the lower jaw forward and thereby create space and the back. We have several sleep device specialists among our dental colleagues here in town.  And I mentioned Dr. Althea Grimmer and Dr. Irene Limbo.     Review of Systems: Out of a complete 14 system review, the patient complains of only the following symptoms, and all other reviewed systems are negative. sometimes difficulty swallowing sometimes during the night feeling of choking, snoring loudly, and ringing in his ears.  He also suffers from ethmoidal sinusitis.  Epworth score 3 points , Fatigue severity score 32  , depression score :0   History   Social History  . Marital Status: Married    Spouse Name: N/A  . Number of Children: N/A  . Years of Education: N/A   Occupational History  . Not on file.   Social History Main Topics  . Smoking status: Former Smoker    Quit date: 04/08/1981  . Smokeless tobacco: Never Used  . Alcohol Use: No  . Drug Use: No  . Sexual Activity: Yes   Other Topics Concern  . Not on file   Social History Narrative   Caffeine 2 cups daily avg.    History reviewed. No pertinent family history.  History reviewed. No pertinent past medical history.  Past Surgical History  Procedure Laterality Date  . Appendectomy      Current Outpatient Prescriptions  Medication Sig Dispense Refill  . aspirin 81 MG tablet Take 81 mg by mouth daily.    Marland Kitchen co-enzyme Q-10 30 MG capsule Take 30 mg by mouth daily.     . fluticasone (FLONASE) 50 MCG/ACT nasal spray Place 2 sprays into both nostrils daily. 16 g 6  . glucosamine-chondroitin 500-400 MG tablet Take 1 tablet by mouth 2 (two) times daily.    . Multiple Vitamin (MULTIVITAMIN) tablet Take 1 tablet by mouth daily.    . pravastatin (PRAVACHOL) 40 MG tablet Take 1 tablet at night 30 tablet 11  . UNABLE TO FIND Take 1  capsule by mouth. Med Name:Tumeric cap     No current facility-administered medications for this visit.    Allergies as of 06/27/2014  . (No Known Allergies)    Vitals: BP 121/75 mmHg  Pulse 67  Resp 12  Ht 6' (1.829 m)  Wt 236 lb 6.4 oz (107.23 kg)  BMI 32.05 kg/m2 Last Weight:  Wt Readings from Last 1 Encounters:  06/27/14 236 lb 6.4 oz (107.23 kg)       Last Height:   Ht Readings from Last 1 Encounters:  06/27/14 6' (1.829 m)    Physical exam:  General: The patient is awake, alert and appears not in acute distress. The patient is well groomed. Head: Normocephalic, atraumatic. Neck is supple. Mallampati 4/5    neck circumference: 18". Nasal airflow restricted -, TMJ is  Not  evident . Retrognathia is seen.  Cardiovascular:  Regular rate and rhythm, without  murmurs or carotid bruit, and without distended neck veins. Respiratory: Lungs are clear to auscultation. Skin:  Without evidence of edema, or rash Trunk: BMI is elevated and patient  has normal posture.  Neurologic exam : The patient is awake and alert, oriented to place and time.   Memory subjective  described as intact.  There is a normal attention span & concentration ability. Speech is fluent with Nasal dysphonia  Mood and affect are appropriate.  Cranial nerves: Pupils are equal and briskly reactive to light. Funduscopic exam without evidence of pallor or edema.  Floaters in the left eye.  Extraocular movements  in vertical and horizontal planes intact and without nystagmus. Visual fields by finger perimetry are intact. Hearing to finger rub intact. Facial sensation intact to fine touch.  Facial motor strength is symmetric and tongue and uvula move midline.  Motor exam:   Normal tone, muscle bulk and symmetric strength in all extremities.  Assessment:  After physical and neurologic examination, review of laboratory studies, imaging, neurophysiology testing and pre-existing records, assessment is  1) patient  with a history of uveitis and iritis.  No dry mouth , but chronical nasal congestion.  The patient has chronic ethmoidal sinusitis which was recently successfully treated with antibiotics but he still has nasal congestion.   2)He also has retrognathia and a very high-grade Mallampati, BMI :  his low uvula is likely responsible for his snoring habit.   I have referred the patient to discuss with his established dentist if a dental advancement device could be made and if his dentist does not provide the service which dentist in town usually do a lot of them. I have added Nasacort.   The patient was advised of the nature of the diagnosed sleep disorder , which is mainly snoring and very mild REM dependent sleep apnea.   the treatment options and risks for general a health and wellness arising from not treating the condition.  Visit duration was 15  minutes. 50 %of our face to face time was spent in discussion of sleep apnea types, and of treatment options.   Plan:  Treatment plan and additional workup :  1)referral to sleep dentist , 2) weight loss, 3)nasocort. Nasal spray.   Porfirio Mylararmen Lora Glomski MD  06/27/2014

## 2015-01-10 ENCOUNTER — Encounter: Payer: Self-pay | Admitting: Emergency Medicine

## 2015-01-28 ENCOUNTER — Telehealth: Payer: Self-pay

## 2015-01-28 ENCOUNTER — Other Ambulatory Visit: Payer: Self-pay | Admitting: Emergency Medicine

## 2015-01-28 NOTE — Telephone Encounter (Signed)
Pt is filling out his [facility] [employer] and needs information on last physical. ( diastolic, systolic, blood sugar levels etc.) This is due by the end of the month.  Please advise him  [phone]

## 2015-01-30 NOTE — Telephone Encounter (Signed)
Spoke with pt, he would like last OV mailed to him. Medical records does he need to sign a release?

## 2015-01-31 NOTE — Telephone Encounter (Signed)
Yes,  I called patient LMOVM requesting patient to CB.   He does need to complete a ROI for his records.

## 2015-02-01 NOTE — Telephone Encounter (Signed)
No an ROI doesn't need to be completed, we can not do a verbal release form that is in medical records and then we can send the records, I have completed this and mailed the ov notes.

## 2015-03-18 ENCOUNTER — Telehealth: Payer: Self-pay | Admitting: Emergency Medicine

## 2015-03-18 NOTE — Telephone Encounter (Signed)
Call patient. Last year he was scheduled for scan of his kidney because of hematuria and this was never done. Does he want us to go ahead and reschedule the scan or does he want to return to clinic next week for repeat urine.

## 2015-03-18 NOTE — Telephone Encounter (Signed)
Call patient. He was advised last year to have a scan of his kidneys because of blood in his urine. He can either have us go ahead and do the scan or return to clinic sometime next week for a repeat urine and be sure his hematuria has cleared.

## 2015-03-20 NOTE — Telephone Encounter (Signed)
Called pt, advised message from Dr. Cleta Albertsaub. Pt will come in to see Dr. Cleta Albertsaub.

## 2015-05-21 ENCOUNTER — Ambulatory Visit (INDEPENDENT_AMBULATORY_CARE_PROVIDER_SITE_OTHER): Payer: BC Managed Care – PPO | Admitting: Family Medicine

## 2015-05-21 VITALS — BP 124/82 | HR 99 | Temp 98.5°F | Resp 18 | Ht 71.75 in | Wt 240.0 lb

## 2015-05-21 DIAGNOSIS — J309 Allergic rhinitis, unspecified: Secondary | ICD-10-CM

## 2015-05-21 DIAGNOSIS — J019 Acute sinusitis, unspecified: Secondary | ICD-10-CM | POA: Diagnosis not present

## 2015-05-21 MED ORDER — AMOXICILLIN-POT CLAVULANATE 875-125 MG PO TABS: 1.0000 | ORAL_TABLET | Freq: Two times a day (BID) | ORAL | Status: DC

## 2015-05-21 NOTE — Patient Instructions (Addendum)
You may have had initial allergy flare or virus with now secondary sinus infection. See information on this below, start antibiotic. Okay to continue decongestant during the day, but Mucinex and saline nasal spray may also help with your symptoms. Claritin, Zyrtec, or Allegra once per day for allergies, and Flonase nasal spray as needed for allergies in the future. Return to the clinic or go to the nearest emergency room if any of your symptoms worsen or new symptoms occur.  Sinusitis, Adult Sinusitis is redness, soreness, and inflammation of the paranasal sinuses. Paranasal sinuses are air pockets within the bones of your face. They are located beneath your eyes, in the middle of your forehead, and above your eyes. In healthy paranasal sinuses, mucus is able to drain out, and air is able to circulate through them by way of your nose. However, when your paranasal sinuses are inflamed, mucus and air can become trapped. This can allow bacteria and other germs to grow and cause infection. Sinusitis can develop quickly and last only a short time (acute) or continue over a long period (chronic). Sinusitis that lasts for more than 12 weeks is considered chronic. CAUSES Causes of sinusitis include:  Allergies.  Structural abnormalities, such as displacement of the cartilage that separates your nostrils (deviated septum), which can decrease the air flow through your nose and sinuses and affect sinus drainage.  Functional abnormalities, such as when the small hairs (cilia) that line your sinuses and help remove mucus do not work properly or are not present. SIGNS AND SYMPTOMS Symptoms of acute and chronic sinusitis are the same. The primary symptoms are pain and pressure around the affected sinuses. Other symptoms include:  Upper toothache.  Earache.  Headache.  Bad breath.  Decreased sense of smell and taste.  A cough, which worsens when you are lying flat.  Fatigue.  Fever.  Thick drainage  from your nose, which often is green and may contain pus (purulent).  Swelling and warmth over the affected sinuses. DIAGNOSIS Your health care provider will perform a physical exam. During your exam, your health care provider may perform any of the following to help determine if you have acute sinusitis or chronic sinusitis:  Look in your nose for signs of abnormal growths in your nostrils (nasal polyps).  Tap over the affected sinus to check for signs of infection.  View the inside of your sinuses using an imaging device that has a light attached (endoscope). If your health care provider suspects that you have chronic sinusitis, one or more of the following tests may be recommended:  Allergy tests.  Nasal culture. A sample of mucus is taken from your nose, sent to a lab, and screened for bacteria.  Nasal cytology. A sample of mucus is taken from your nose and examined by your health care provider to determine if your sinusitis is related to an allergy. TREATMENT Most cases of acute sinusitis are related to a viral infection and will resolve on their own within 10 days. Sometimes, medicines are prescribed to help relieve symptoms of both acute and chronic sinusitis. These may include pain medicines, decongestants, nasal steroid sprays, or saline sprays. However, for sinusitis related to a bacterial infection, your health care provider will prescribe antibiotic medicines. These are medicines that will help kill the bacteria causing the infection. Rarely, sinusitis is caused by a fungal infection. In these cases, your health care provider will prescribe antifungal medicine. For some cases of chronic sinusitis, surgery is needed. Generally, these are cases  in which sinusitis recurs more than 3 times per year, despite other treatments. HOME CARE INSTRUCTIONS  Drink plenty of water. Water helps thin the mucus so your sinuses can drain more easily.  Use a humidifier.  Inhale steam 3-4 times a  day (for example, sit in the bathroom with the shower running).  Apply a warm, moist washcloth to your face 3-4 times a day, or as directed by your health care provider.  Use saline nasal sprays to help moisten and clean your sinuses.  Take medicines only as directed by your health care provider.  If you were prescribed either an antibiotic or antifungal medicine, finish it all even if you start to feel better. SEEK IMMEDIATE MEDICAL CARE IF:  You have increasing pain or severe headaches.  You have nausea, vomiting, or drowsiness.  You have swelling around your face.  You have vision problems.  You have a stiff neck.  You have difficulty breathing.   This information is not intended to replace advice given to you by your health care provider. Make sure you discuss any questions you have with your health care provider.   Document Released: 03/25/2005 Document Revised: 04/15/2014 Document Reviewed: 04/09/2011 Elsevier Interactive Patient Education 2016 [employer]. Allergic Rhinitis Allergic rhinitis is when the mucous membranes in the nose respond to allergens. Allergens are particles in the air that cause your body to have an allergic reaction. This causes you to release allergic antibodies. Through a chain of events, these eventually cause you to release histamine into the blood stream. Although meant to protect the body, it is this release of histamine that causes your discomfort, such as frequent sneezing, congestion, and an itchy, runny nose.  CAUSES Seasonal allergic rhinitis (hay fever) is caused by pollen allergens that may come from grasses, trees, and weeds. Year-round allergic rhinitis (perennial allergic rhinitis) is caused by allergens such as house dust mites, pet dander, and mold spores. SYMPTOMS  Nasal stuffiness (congestion).  Itchy, runny nose with sneezing and tearing of the eyes. DIAGNOSIS Your health care provider can help you determine the allergen or  allergens that trigger your symptoms. If you and your health care provider are unable to determine the allergen, skin or blood testing may be used. Your health care provider will diagnose your condition after taking your health history and performing a physical exam. Your health care provider may assess you for other related conditions, such as asthma, pink eye, or an ear infection. TREATMENT Allergic rhinitis does not have a cure, but it can be controlled by:  Medicines that block allergy symptoms. These may include allergy shots, nasal sprays, and oral antihistamines.  Avoiding the allergen. Hay fever may often be treated with antihistamines in pill or nasal spray forms. Antihistamines block the effects of histamine. There are over-the-counter medicines that may help with nasal congestion and swelling around the eyes. Check with your health care provider before taking or giving this medicine. If avoiding the allergen or the medicine prescribed do not work, there are many new medicines your health care provider can prescribe. Stronger medicine may be used if initial measures are ineffective. Desensitizing injections can be used if medicine and avoidance does not work. Desensitization is when a patient is given ongoing shots until the body becomes less sensitive to the allergen. Make sure you follow up with your health care provider if problems continue. HOME CARE INSTRUCTIONS It is not possible to completely avoid allergens, but you can reduce your symptoms by taking steps to  limit your exposure to them. It helps to know exactly what you are allergic to so that you can avoid your specific triggers. SEEK MEDICAL CARE IF:  You have a fever.  You develop a cough that does not stop easily (persistent).  You have shortness of breath.  You start wheezing.  Symptoms interfere with normal daily activities.   This information is not intended to replace advice given to you by your health care provider.  Make sure you discuss any questions you have with your health care provider.   Document Released: 12/18/2000 Document Revised: 04/15/2014 Document Reviewed: 11/30/2012 Elsevier Interactive Patient Education [employer]

## 2015-05-21 NOTE — Progress Notes (Signed)
Subjective:  By signing my name below, I, [name], attest that this documentation has been prepared under the direction and in the presence of [name], MD.  Electronically Signed: [name], ED Scribe. 05/21/2015. 1:36 PM.   Patient ID: Stephen Cannon, male    DOB: [birth year], 60 y.o.   MRN: [id number]  HPI Chief Complaint  Patient presents with  . Sinusitis    HPI Comments: Stephen Cannon is a 60 y.o. male who presents to the [facility] complaining of worsening sinus congestion that began 1 week ago. He reports mild green drainage, worsening sinus pressure, itchy eyes and watery eyes. He reports symptoms were initially getting better but states worsened today. He has been taking OTC decongestant that contain sudafed and an antihistamine. He does not take allergy medication regularly. He denies fevers and chills. He received flu shot at work in October. He denies drug allergies.   Patient Active Problem List   Diagnosis Date Noted  . Allergic rhinitis due to pollen 06/27/2014  . 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 Primary snoring 06/27/2014  . Sleep apnea 05/31/2014  . Snoring 05/31/2014  . Low testosterone 01/03/2014  . Allergic conjunctivitis 01/29/2012  . Hypercholesterolemia 08/01/2011   History reviewed. No pertinent past medical history. Past Surgical History  Procedure Laterality Date  . Appendectomy     No Known Allergies Prior to Admission medications   Medication Sig Start Date End Date Taking? Authorizing Provider  aspirin 81 MG tablet Take 81 mg by mouth daily.   Yes Historical Provider, MD  co-enzyme Q-10 30 MG capsule Take 30 mg by mouth daily.    Yes Historical Provider, MD  glucosamine-chondroitin 500-400 MG tablet Take 1 tablet by mouth 2 (two) times daily.   Yes Historical Provider, MD  Multiple Vitamin (MULTIVITAMIN) tablet Take 1 tablet by mouth daily.   Yes Historical Provider, MD  pravastatin (PRAVACHOL) 40 MG tablet TAKE 1 TABLET  IN THE P.M.    "OFFICE VISIT NEEDED FOR REFILLS" 01/29/15  Yes Collene Gobble, MD  triamcinolone (NASACORT AQ) 55 MCG/ACT AERO nasal inhaler Place 2 sprays into the nose daily. 06/27/14  Yes Carmen Dohmeier, MD  UNABLE TO FIND Take 1 capsule by mouth. Med Name:Tumeric cap   Yes Historical Provider, MD  fluticasone (FLONASE) 50 MCG/ACT nasal spray Place 2 sprays into both nostrils daily. Patient not taking: Reported on 05/21/2015 05/30/13   Collene Gobble, MD   Social History   Social History  . Marital Status: Married    Spouse Name: N/A  . Number of Children: N/A  . Years of Education: N/A   Occupational History  . Not on file.   Social History Main Topics  . Smoking status: Former Smoker    Quit date: 04/08/1981  . Smokeless tobacco: Never Used  . Alcohol Use: No  . Drug Use: No  . Sexual Activity: Yes   Other Topics Concern  . Not on file   Social History Narrative   Caffeine 2 cups daily avg.   Review of Systems  Constitutional: Positive for fatigue. Negative for fever and chills.  HENT: Positive for congestion and sinus pressure.   Eyes: Positive for discharge ( watery) and itching.  Respiratory: Positive for cough.     Objective:   Physical Exam  Constitutional: He is oriented to person, place, and time. He appears well-developed and well-nourished.  HENT:  Head: Normocephalic and atraumatic.  Right Ear: Tympanic membrane, external ear and ear canal normal.  Left Ear: Tympanic membrane, external ear and ear canal normal.  Nose: No rhinorrhea. Right sinus exhibits maxillary sinus tenderness and frontal sinus tenderness. Left sinus exhibits maxillary sinus tenderness and frontal sinus tenderness.  Mouth/Throat: Oropharynx is clear and moist and mucous membranes are normal. No oropharyngeal exudate or posterior oropharyngeal erythema.  Slight sinus tenderness.   Eyes: Conjunctivae are normal. Pupils are equal, round, and reactive to light.  Neck: Neck supple.    Cardiovascular: Normal rate, regular rhythm, normal heart sounds and intact distal pulses.   No murmur heard. Pulmonary/Chest: Effort normal and breath sounds normal. He has no wheezes. He has no rhonchi. He has no rales.  Abdominal: Soft. There is no tenderness.  Lymphadenopathy:    He has no cervical adenopathy.  Neurological: He is alert and oriented to person, place, and time.  Skin: Skin is warm and dry. No rash noted.  Psychiatric: He has a normal mood and affect. His behavior is normal.  Vitals reviewed.  Filed Vitals:   05/21/15 1314  BP: 124/82  Pulse: 99  Temp: 98.5 F (36.9 C)  TempSrc: Oral  Resp: 18  Height: 5' 11.75" (1.822 m)  Weight: 240 lb (108.863 kg)  SpO2: 99%    Assessment & Plan:    Stephen Cannon is a 59 y.o. male Acute sinusitis, recurrence not specified, unspecified location - Plan: amoxicillin-clavulanate (AUGMENTIN) 875-125 MG tablet  Allergic rhinitis, unspecified allergic rhinitis type  Suspect allergic rhinitis versus viral infection with secondary sinus infection. Early sinus infection. Will start Augmentin, side effects discussed, If he is having significant diarrhea or side effects, may need to change to amoxicillin as he did take that with relief in the past, and early sinus symptoms. Antihistamines as needed for allergies, Flonase as needed for allergies, saline nasal spray, Mucinex when necessary. RTC precautions.  Meds ordered this encounter  Medications  . amoxicillin-clavulanate (AUGMENTIN) 875-125 MG tablet    Sig: Take 1 tablet by mouth 2 (two) times daily.    Dispense:  20 tablet    Refill:  0   Patient Instructions  You may have had initial allergy flare or virus with now secondary sinus infection. See information on this below, start antibiotic. Okay to continue decongestant during the day, but Mucinex and saline nasal spray may also help with your symptoms. Claritin, Zyrtec, or Allegra once per day for allergies, and Flonase  nasal spray as needed for allergies in the future. Return to the clinic or go to the nearest emergency room if any of your symptoms worsen or new symptoms occur.  Sinusitis, Adult Sinusitis is redness, soreness, and inflammation of the paranasal sinuses. Paranasal sinuses are air pockets within the bones of your face. They are located beneath your eyes, in the middle of your forehead, and above your eyes. In healthy paranasal sinuses, mucus is able to drain out, and air is able to circulate through them by way of your nose. However, when your paranasal sinuses are inflamed, mucus and air can become trapped. This can allow bacteria and other germs to grow and cause infection. Sinusitis can develop quickly and last only a short time (acute) or continue over a long period (chronic). Sinusitis that lasts for more than 12 weeks is considered chronic. CAUSES Causes of sinusitis include:  Allergies.  Structural abnormalities, such as displacement of the cartilage that separates your nostrils (deviated septum), which can decrease the air flow through your nose and sinuses and affect sinus drainage.  Functional abnormalities, such as when  the small hairs (cilia) that line your sinuses and help remove mucus do not work properly or are not present. SIGNS AND SYMPTOMS Symptoms of acute and chronic sinusitis are the same. The primary symptoms are pain and pressure around the affected sinuses. Other symptoms include:  Upper toothache.  Earache.  Headache.  Bad breath.  Decreased sense of smell and taste.  A cough, which worsens when you are lying flat.  Fatigue.  Fever.  Thick drainage from your nose, which often is green and may contain pus (purulent).  Swelling and warmth over the affected sinuses. DIAGNOSIS Your health care provider will perform a physical exam. During your exam, your health care provider may perform any of the following to help determine if you have acute sinusitis or  chronic sinusitis:  Look in your nose for signs of abnormal growths in your nostrils (nasal polyps).  Tap over the affected sinus to check for signs of infection.  View the inside of your sinuses using an imaging device that has a light attached (endoscope). If your health care provider suspects that you have chronic sinusitis, one or more of the following tests may be recommended:  Allergy tests.  Nasal culture. A sample of mucus is taken from your nose, sent to a lab, and screened for bacteria.  Nasal cytology. A sample of mucus is taken from your nose and examined by your health care provider to determine if your sinusitis is related to an allergy. TREATMENT Most cases of acute sinusitis are related to a viral infection and will resolve on their own within 10 days. Sometimes, medicines are prescribed to help relieve symptoms of both acute and chronic sinusitis. These may include pain medicines, decongestants, nasal steroid sprays, or saline sprays. However, for sinusitis related to a bacterial infection, your health care provider will prescribe antibiotic medicines. These are medicines that will help kill the bacteria causing the infection. Rarely, sinusitis is caused by a fungal infection. In these cases, your health care provider will prescribe antifungal medicine. For some cases of chronic sinusitis, surgery is needed. Generally, these are cases in which sinusitis recurs more than 3 times per year, despite other treatments. HOME CARE INSTRUCTIONS  Drink plenty of water. Water helps thin the mucus so your sinuses can drain more easily.  Use a humidifier.  Inhale steam 3-4 times a day (for example, sit in the bathroom with the shower running).  Apply a warm, moist washcloth to your face 3-4 times a day, or as directed by your health care provider.  Use saline nasal sprays to help moisten and clean your sinuses.  Take medicines only as directed by your health care provider.  If  you were prescribed either an antibiotic or antifungal medicine, finish it all even if you start to feel better. SEEK IMMEDIATE MEDICAL CARE IF:  You have increasing pain or severe headaches.  You have nausea, vomiting, or drowsiness.  You have swelling around your face.  You have vision problems.  You have a stiff neck.  You have difficulty breathing.   This information is not intended to replace advice given to you by your health care provider. Make sure you discuss any questions you have with your health care provider.   Document Released: 03/25/2005 Document Revised: 04/15/2014 Document Reviewed: 04/09/2011 Elsevier Interactive Patient Education 2016 [employer]. Allergic Rhinitis Allergic rhinitis is when the mucous membranes in the nose respond to allergens. Allergens are particles in the air that cause your body to have an allergic  reaction. This causes you to release allergic antibodies. Through a chain of events, these eventually cause you to release histamine into the blood stream. Although meant to protect the body, it is this release of histamine that causes your discomfort, such as frequent sneezing, congestion, and an itchy, runny nose.  CAUSES Seasonal allergic rhinitis (hay fever) is caused by pollen allergens that may come from grasses, trees, and weeds. Year-round allergic rhinitis (perennial allergic rhinitis) is caused by allergens such as house dust mites, pet dander, and mold spores. SYMPTOMS  Nasal stuffiness (congestion).  Itchy, runny nose with sneezing and tearing of the eyes. DIAGNOSIS Your health care provider can help you determine the allergen or allergens that trigger your symptoms. If you and your health care provider are unable to determine the allergen, skin or blood testing may be used. Your health care provider will diagnose your condition after taking your health history and performing a physical exam. Your health care provider may assess you for  other related conditions, such as asthma, pink eye, or an ear infection. TREATMENT Allergic rhinitis does not have a cure, but it can be controlled by:  Medicines that block allergy symptoms. These may include allergy shots, nasal sprays, and oral antihistamines.  Avoiding the allergen. Hay fever may often be treated with antihistamines in pill or nasal spray forms. Antihistamines block the effects of histamine. There are over-the-counter medicines that may help with nasal congestion and swelling around the eyes. Check with your health care provider before taking or giving this medicine. If avoiding the allergen or the medicine prescribed do not work, there are many new medicines your health care provider can prescribe. Stronger medicine may be used if initial measures are ineffective. Desensitizing injections can be used if medicine and avoidance does not work. Desensitization is when a patient is given ongoing shots until the body becomes less sensitive to the allergen. Make sure you follow up with your health care provider if problems continue. HOME CARE INSTRUCTIONS It is not possible to completely avoid allergens, but you can reduce your symptoms by taking steps to limit your exposure to them. It helps to know exactly what you are allergic to so that you can avoid your specific triggers. SEEK MEDICAL CARE IF:  You have a fever.  You develop a cough that does not stop easily (persistent).  You have shortness of breath.  You start wheezing.  Symptoms interfere with normal daily activities.   This information is not intended to replace advice given to you by your health care provider. Make sure you discuss any questions you have with your health care provider.   Document Released: 12/18/2000 Document Revised: 04/15/2014 Document Reviewed: 11/30/2012 Elsevier Interactive Patient Education [employer]     I personally performed the services described in this documentation,  which was scribed in my presence. The recorded information has been reviewed and considered, and addended by me as needed.

## 2015-09-05 ENCOUNTER — Ambulatory Visit (INDEPENDENT_AMBULATORY_CARE_PROVIDER_SITE_OTHER): Payer: Self-pay | Admitting: Physician Assistant

## 2015-09-05 VITALS — BP 128/80 | HR 84 | Temp 98.0°F | Resp 17 | Ht 70.5 in | Wt 243.0 lb

## 2015-09-05 DIAGNOSIS — Z0289 Encounter for other administrative examinations: Secondary | ICD-10-CM

## 2015-09-05 NOTE — Patient Instructions (Signed)
     IF you received an x-ray today, you will receive an invoice from [facility]. Please contact [facility] at [phone] with questions or concerns regarding your invoice.   IF you received labwork today, you will receive an invoice from [facility]. Please contact [facility] at [phone] with questions or concerns regarding your invoice.   Our billing staff will not be able to assist you with questions regarding bills from these companies.  You will be contacted with the lab results as soon as they are available. The fastest way to get your results is to activate your My Chart account. Instructions are located on the last page of this paperwork. If you have not heard from us regarding the results in 2 weeks, please contact this office.      

## 2015-09-05 NOTE — Progress Notes (Signed)
Commercial Driver Medical Examination   Stephen Cannon is a 60 y.o. male who presents today for a commercial driver fitness determination physical exam. The patient reports no problems.  He has a history of "sleep apnea," per the chart however this was investigated and revealed no sleep apnea, however he did have some hypopnea episodes.  He has no history of diabetes and HTN.     The following portions of the patient's history were reviewed and updated as appropriate: allergies, current medications, past family history, past medical history, past social history, past surgical history and problem list. Review of Systems Pertinent items are noted in HPI.   Objective:    Vision/hearing:  Visual Acuity Screening   Right eye Left eye Both eyes  Without correction: 20/25 20/25 20/25   With correction:     Hearing Screening Comments: Peripheral Vision: Right eye 85 degrees. Left eye 85 degrees. The patient can distinguish the colors red, amber and green. The patient was able to hear a forced whisper from L=10, R=10 feet.  Applicant can recognize and distinguish among traffic control signals and devices showing standard red, green, and amber colors.  Corrective lenses required: No  Monocular Vision?: No  Hearing aid requirement: No  Physical Exam  Constitutional: He is oriented to person, place, and time. He appears well-developed. He does not appear ill.  Eyes: Conjunctivae and EOM are normal. Pupils are equal, round, and reactive to light.  Cardiovascular: Normal rate.   Pulmonary/Chest: Effort normal.  Abdominal: He exhibits no distension.  Musculoskeletal: Normal range of motion.  Neurological: He is alert and oriented to person, place, and time. No cranial nerve deficit. Coordination normal.  Skin: Skin is warm and dry. He is not diaphoretic.  Psychiatric: He has a normal mood and affect.  Nursing note and vitals reviewed.   BP 128/80 mmHg  Pulse 84  Temp(Src) 98 F (36.7  C) (Oral)  Resp 17  Ht 5' 10.5" (1.791 m)  Wt 243 lb (110.224 kg)  BMI 34.36 kg/m2  SpO2 94%  Labs: Comments: GLU:NEG, PRO:NEG,BLOOD:TRACE SPGR 1.020  Assessment:    Healthy male exam.  Meets standards in 3749 CFR 391.41;  qualifies for 2 year certificate.    Plan:    Medical examiners certificate completed and printed. Return as needed.

## 2016-05-15 ENCOUNTER — Ambulatory Visit (INDEPENDENT_AMBULATORY_CARE_PROVIDER_SITE_OTHER): Payer: BC Managed Care – PPO | Admitting: Family Medicine

## 2016-05-15 VITALS — BP 132/70 | HR 90 | Temp 98.4°F | Resp 20 | Ht 70.5 in | Wt 243.0 lb

## 2016-05-15 DIAGNOSIS — R6889 Other general symptoms and signs: Secondary | ICD-10-CM

## 2016-05-15 LAB — POCT INFLUENZA A/B
INFLUENZA A, POC: NEGATIVE
Influenza B, POC: NEGATIVE

## 2016-05-15 MED ORDER — HYDROCODONE-HOMATROPINE 5-1.5 MG/5ML PO SYRP: 5.0000 mL | ORAL_SOLUTION | Freq: Three times a day (TID) | ORAL | 0 refills | Status: DC | PRN

## 2016-05-15 NOTE — Progress Notes (Signed)
   SUBJECTIVE: URI symptoms:  Stephen Cannon is a 61 y.o. male who complains of URI symptoms present for past 3 days.  Describes rhinorrhea, sinus congestion, mild cough.  Some body aches.  Has tried OTC meds without relief.  Sick contacts are none that he knows of.  Subjective fevers at home.. No nausea or vomiting.  Denies smoking cigarettes.  He has a strong history of seasonal allergy secondary to pollen. Also chronic history of recurrent sinus infections. He recently had sinus surgery to help with this.  No intrinsic lung disease.  ROS as above.    PMH reviewed. Patient is a nonsmoker.   Medications reviewed.  Physical Exam:  BP 132/70   Pulse 90   Temp 98.4 F (36.9 C) (Oral)   Resp 20   Ht 5' 10.5" (1.791 m)   Wt 243 lb (110.2 kg)   SpO2 96%   BMI 34.37 kg/m  Gen:  Patient sitting on exam table, appears stated age in no acute distress Head: Normocephalic atraumatic Eyes: EOMI, PERRL, sclera and conjunctiva non-erythematous Ears:  Canals clear bilaterally.  TMs pearly gray bilaterally without erythema or bulging.   Nose:  Nasal turbinates with some clear exudates BL.  Mouth: Mucosa membranes moist. Tonsils +2, nonenlarged, non-erythematous. Neck: No cervical lymphadenopathy noted Heart:  RRR, no murmurs auscultated. Pulm:  Clear to auscultation bilaterally with good air movement.  No wheezes or rales noted.   Results for orders placed or performed in visit on 05/15/16  POCT Influenza A/B  Result Value Ref Range   Influenza A, POC Negative Negative   Influenza B, POC Negative Negative     Assessment and Plan:  1.  Flu-like symptoms: - negative flu here.  Symptoms are mild.  PE is good.  -He did not receive a flu shot that she appeared -Plan to treat symptomatically. -Follow-up next week if no improvement. Follow up sooner if worsening.

## 2016-05-15 NOTE — Patient Instructions (Addendum)
  Your flu test was negative. I think you have a viral cold that is causing your symptoms.  Take the hycodan for cough at night.    You should be better in the next few days.    It was good to meet you today!   IF you received an x-ray today, you will receive an invoice from [facility]. Please contact [facility] at [phone] with questions or concerns regarding your invoice.   IF you received labwork today, you will receive an invoice from [facility]. Please contact [facility] at [phone] with questions or concerns regarding your invoice.   Our billing staff will not be able to assist you with questions regarding bills from these companies.  You will be contacted with the lab results as soon as they are available. The fastest way to get your results is to activate your My Chart account. Instructions are located on the last page of this paperwork. If you have not heard from us regarding the results in 2 weeks, please contact this office.

## 2016-05-17 ENCOUNTER — Ambulatory Visit (INDEPENDENT_AMBULATORY_CARE_PROVIDER_SITE_OTHER): Payer: BC Managed Care – PPO

## 2016-05-17 ENCOUNTER — Ambulatory Visit (INDEPENDENT_AMBULATORY_CARE_PROVIDER_SITE_OTHER): Payer: BC Managed Care – PPO | Admitting: Family Medicine

## 2016-05-17 VITALS — BP 132/80 | HR 100 | Temp 98.4°F | Resp 17 | Ht 71.5 in | Wt 245.0 lb

## 2016-05-17 DIAGNOSIS — R062 Wheezing: Secondary | ICD-10-CM

## 2016-05-17 DIAGNOSIS — J209 Acute bronchitis, unspecified: Secondary | ICD-10-CM | POA: Diagnosis not present

## 2016-05-17 MED ORDER — AZITHROMYCIN 250 MG PO TABS: ORAL_TABLET | ORAL | 0 refills | Status: DC

## 2016-05-17 NOTE — Patient Instructions (Addendum)
   IF you received an x-ray today, you will receive an invoice from [facility]. Please contact [facility] at [phone] with questions or concerns regarding your invoice.   IF you received labwork today, you will receive an invoice from [facility]. Please contact [facility] at [phone] with questions or concerns regarding your invoice.   Our billing staff will not be able to assist you with questions regarding bills from these companies.  You will be contacted with the lab results as soon as they are available. The fastest way to get your results is to activate your My Chart account. Instructions are located on the last page of this paperwork. If you have not heard from us regarding the results in 2 weeks, please contact this office.      Acute Bronchitis, Adult Acute bronchitis is sudden (acute) swelling of the air tubes (bronchi) in the lungs. Acute bronchitis causes these tubes to fill with mucus, which can make it hard to breathe. It can also cause coughing or wheezing. In adults, acute bronchitis usually goes away within 2 weeks. A cough caused by bronchitis may last up to 3 weeks. Smoking, allergies, and asthma can make the condition worse. Repeated episodes of bronchitis may cause further lung problems, such as chronic obstructive pulmonary disease (COPD). What are the causes? This condition can be caused by germs and by substances that irritate the lungs, including:  Cold and flu viruses. This condition is most often caused by the same virus that causes a cold.  Bacteria.  Exposure to tobacco smoke, dust, fumes, and air pollution.  What increases the risk? This condition is more likely to develop in people who:  Have close contact with someone with acute bronchitis.  Are exposed to lung irritants, such as tobacco smoke, dust, fumes, and vapors.  Have a weak immune system.  Have a respiratory condition such as asthma.  What are the signs or  symptoms? Symptoms of this condition include:  A cough.  Coughing up clear, yellow, or green mucus.  Wheezing.  Chest congestion.  Shortness of breath.  A fever.  Body aches.  Chills.  A sore throat.  How is this diagnosed? This condition is usually diagnosed with a physical exam. During the exam, your health care provider may order tests, such as chest X-rays, to rule out other conditions. He or she may also:  Test a sample of your mucus for bacterial infection.  Check the level of oxygen in your blood. This is done to check for pneumonia.  Do a chest X-ray or lung function testing to rule out pneumonia and other conditions.  Perform blood tests.  Your health care provider will also ask about your symptoms and medical history. How is this treated? Most cases of acute bronchitis clear up over time without treatment. Your health care provider may recommend:  Drinking more fluids. Drinking more makes your mucus thinner, which may make it easier to breathe.  Taking a medicine for a fever or cough.  Taking an antibiotic medicine.  Using an inhaler to help improve shortness of breath and to control a cough.  Using a cool mist vaporizer or humidifier to make it easier to breathe.  Follow these instructions at home: Medicines  Take over-the-counter and prescription medicines only as told by your health care provider.  If you were prescribed an antibiotic, take it as told by your health care provider. Do not stop taking the antibiotic even if you start to feel better. General instructions    Get plenty of rest.  Drink enough fluids to keep your urine clear or pale yellow.  Avoid smoking and secondhand smoke. Exposure to cigarette smoke or irritating chemicals will make bronchitis worse. If you smoke and you need help quitting, ask your health care provider. Quitting smoking will help your lungs heal faster.  Use an inhaler, cool mist vaporizer, or humidifier as told  by your health care provider.  Keep all follow-up visits as told by your health care provider. This is important. How is this prevented? To lower your risk of getting this condition again:  Wash your hands often with soap and water. If soap and water are not available, use hand sanitizer.  Avoid contact with people who have cold symptoms.  Try not to touch your hands to your mouth, nose, or eyes.  Make sure to get the flu shot every year.  Contact a health care provider if:  Your symptoms do not improve in 2 weeks of treatment. Get help right away if:  You cough up blood.  You have chest pain.  You have severe shortness of breath.  You become dehydrated.  You faint or keep feeling like you are going to faint.  You keep vomiting.  You have a severe headache.  Your fever or chills gets worse. This information is not intended to replace advice given to you by your health care provider. Make sure you discuss any questions you have with your health care provider. Document Released: 05/02/2004 Document Revised: 10/18/2015 Document Reviewed: 09/13/2015 Elsevier Interactive Patient Education  2017 [employer]vier Inc.  

## 2016-05-17 NOTE — Progress Notes (Signed)
Chief Complaint  Patient presents with  . Follow-up    flu     HPI  Pt was seen on 05/15/16 Pt reports that he is feeling worse today He reports that he hears a wheezing when he takes a deep breath He reports that his symptoms are getting worse.  He reports that the coughing and headache from the cough is worse He states that he is taking advil for the cough  No past medical history on file.  Current Outpatient Prescriptions  Medication Sig Dispense Refill  . aspirin 81 MG tablet Take 81 mg by mouth daily.    Marland Kitchen. HYDROcodone-homatropine (HYCODAN) 5-1.5 MG/5ML syrup Take 5 mLs by mouth every 8 (eight) hours as needed for cough. 120 mL 0  . Multiple Vitamin (MULTIVITAMIN) tablet Take 1 tablet by mouth daily.    . pravastatin (PRAVACHOL) 40 MG tablet TAKE 1 TABLET IN THE P.M.    "OFFICE VISIT NEEDED FOR REFILLS" 30 tablet 0  . UNABLE TO FIND Take 1 capsule by mouth. Med Name:Tumeric cap    . azithromycin (ZITHROMAX) 250 MG tablet Take 2 tablets on day 1, one tablet each day after 6 tablet 0  . co-enzyme Q-10 30 MG capsule Take 30 mg by mouth daily.      No current facility-administered medications for this visit.     Allergies: No Known Allergies  Past Surgical History:  Procedure Laterality Date  . APPENDECTOMY    . SINUS SURGERY WITH INSTATRAK      Social History   Social History  . Marital status: Married    Spouse name: N/A  . Number of children: N/A  . Years of education: N/A   Social History Main Topics  . Smoking status: Former Smoker    Quit date: 04/08/1981  . Smokeless tobacco: Never Used  . Alcohol use No  . Drug use: No  . Sexual activity: Yes   Other Topics Concern  . None   Social History Narrative   Caffeine 2 cups daily avg.    Review of Systems  Constitutional: Negative for chills and fever.  Cardiovascular: Negative for chest pain and palpitations.  Gastrointestinal: Negative for abdominal pain, constipation, diarrhea, nausea and vomiting.     Objective: Vitals:   05/17/16 1509  BP: 132/80  Pulse: 100  Resp: 17  Temp: 98.4 F (36.9 C)  TempSrc: Oral  SpO2: 94%  Weight: 245 lb (111.1 kg)  Height: 5' 11.5" (1.816 m)    Physical Exam  General: alert, oriented, in NAD Head: normocephalic, atraumatic, no sinus tenderness Eyes: EOM intact, no scleral icterus or conjunctival injection Ears: TM clear bilaterally Throat: no pharyngeal exudate or erythema Lymph: no posterior auricular, submental or cervical lymph adenopathy Heart: normal rate, normal sinus rhythm, no murmurs Lungs: scant wheezing, wheezing resolves after coughing   CLINICAL DATA:  Cough and wheeze  EXAM: CHEST  2 VIEW  COMPARISON:  None.  FINDINGS: The heart size and mediastinal contours are within normal limits. Both lungs are clear. Minimal atelectasis at the left lung base. No overt pulmonary edema, effusion or pneumothorax. The visualized skeletal structures are unremarkable.  IMPRESSION: No active cardiopulmonary disease.   Electronically Signed   By: Tollie Ethavid  Kwon M.D.   On: 05/17/2016 16:04   Assessment and Plan Ilean SkillKimball was seen today for follow-up.  Diagnoses and all orders for this visit:  Wheezing- reviewed xray with pt in the office discussed that the result did not show pneumonia Likely bronchitis -  DG Chest 2 View  Acute bronchitis, unspecified organism- worsening advised guaifenisen and zpak Discussed that he still is mostly likely having a viral syndome but since he is worsening he may have a superimposed bacterial sinusitis  Other orders -     azithromycin (ZITHROMAX) 250 MG tablet; Take 2 tablets on day 1, one tablet each day after     Myiah Petkus A Creta Levin

## 2016-05-23 ENCOUNTER — Ambulatory Visit (INDEPENDENT_AMBULATORY_CARE_PROVIDER_SITE_OTHER): Payer: BC Managed Care – PPO

## 2016-05-23 ENCOUNTER — Ambulatory Visit (INDEPENDENT_AMBULATORY_CARE_PROVIDER_SITE_OTHER): Payer: BC Managed Care – PPO | Admitting: Physician Assistant

## 2016-05-23 VITALS — BP 120/78 | HR 75 | Temp 98.6°F | Resp 16 | Ht 71.5 in | Wt 246.0 lb

## 2016-05-23 DIAGNOSIS — J3489 Other specified disorders of nose and nasal sinuses: Secondary | ICD-10-CM | POA: Diagnosis not present

## 2016-05-23 DIAGNOSIS — R05 Cough: Secondary | ICD-10-CM

## 2016-05-23 DIAGNOSIS — J322 Chronic ethmoidal sinusitis: Secondary | ICD-10-CM

## 2016-05-23 DIAGNOSIS — R059 Cough, unspecified: Secondary | ICD-10-CM

## 2016-05-23 LAB — POCT CBC
Granulocyte percent: 72.4 % (ref 37–80)
HCT, POC: 43.8 % (ref 43.5–53.7)
Hemoglobin: 15.5 g/dL (ref 14.1–18.1)
Lymph, poc: 1.3 (ref 0.6–3.4)
MCH, POC: 30.5 pg (ref 27–31.2)
MCHC: 35.5 g/dL — AB (ref 31.8–35.4)
MCV: 86 fL (ref 80–97)
MID (cbc): 0.5 (ref 0–0.9)
MPV: 7.7 fL (ref 0–99.8)
POC Granulocyte: 4.6 (ref 2–6.9)
POC LYMPH PERCENT: 20.3 %L (ref 10–50)
POC MID %: 7.3 %M (ref 0–12)
Platelet Count, POC: 231 10*3/uL (ref 142–424)
RBC: 5.09 M/uL (ref 4.69–6.13)
RDW, POC: 13.6 %
WBC: 6.3 10*3/uL (ref 4.6–10.2)

## 2016-05-23 MED ORDER — PREDNISONE 10 MG PO TABS: ORAL_TABLET | ORAL | 0 refills | Status: DC

## 2016-05-23 MED ORDER — FLUTICASONE PROPIONATE 50 MCG/ACT NA SUSP: 2.0000 | Freq: Every day | NASAL | 6 refills | Status: DC

## 2016-05-23 MED ORDER — AMOXICILLIN-POT CLAVULANATE 875-125 MG PO TABS: 1.0000 | ORAL_TABLET | Freq: Two times a day (BID) | ORAL | 0 refills | Status: DC

## 2016-05-23 NOTE — Progress Notes (Signed)
Stephen Cannon  MRN: [id number] DOB: 12/29/1955  PCP: No PCP Per Patient  Subjective:  Pt is a 61 year old male PMH allergic rhinitis, HLD, who presents to clinic for f/u flu.  He was here 2/7 for flu-like symptoms and treated symptomatically with Hycodan.  RTC 2/9 for wheezing and was treated for viral bronchitis with possible superimposed bacterial sinusitis with Azithromycin and Mucinex. He finished Z-pack 2 days ago.  Today c/o Sinus pain and pressure L>R. Headache, New-onset Productive cough with yellowish-greenish phlegm. "metallic" taste in the back of throat. Cough is better, however he has coughing attacks.  Using nasal rinses, helps some.  Denies fever, chills, sore throat, chest tightness, SOB, wheezing.  H/o deviated septum, recurrent sinus infections. sinus surgery 2017 due to recurrent sinus infections.   Review of Systems  Constitutional: Negative for chills, diaphoresis and fever.  HENT: Positive for sinus pain and sinus pressure.   Respiratory: Positive for cough. Negative for chest tightness, shortness of breath and wheezing.   Cardiovascular: Negative for chest pain and palpitations.  Gastrointestinal: Negative for diarrhea, nausea and vomiting.  Neurological: Positive for headaches. Negative for dizziness, syncope and light-headedness.  Psychiatric/Behavioral: Positive for sleep disturbance.    Patient Active Problem List   Diagnosis Date Noted  . Allergic rhinitis due to pollen 06/27/2014  . 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 Primary snoring 06/27/2014  . Snoring 05/31/2014  . Low testosterone 01/03/2014  . Allergic conjunctivitis 01/29/2012  . Hypercholesterolemia 08/01/2011    Current Outpatient Prescriptions on File Prior to Visit  Medication Sig Dispense Refill  . aspirin 81 MG tablet Take 81 mg by mouth daily.    Marland Kitchen. co-enzyme Q-10 30 MG capsule Take 30 mg by mouth daily.     Marland Kitchen. HYDROcodone-homatropine (HYCODAN) 5-1.5 MG/5ML syrup Take 5 mLs by mouth every 8  (eight) hours as needed for cough. 120 mL 0  . Multiple Vitamin (MULTIVITAMIN) tablet Take 1 tablet by mouth daily.    . pravastatin (PRAVACHOL) 40 MG tablet TAKE 1 TABLET IN THE P.M.    "OFFICE VISIT NEEDED FOR REFILLS" 30 tablet 0  . UNABLE TO FIND Take 1 capsule by mouth. Med Name:Tumeric cap     No current facility-administered medications on file prior to visit.     No Known Allergies   Objective:  BP 120/78   Pulse 75   Temp 98.6 F (37 C) (Oral)   Resp 16   Ht 5' 11.5" (1.816 m)   Wt 246 lb (111.6 kg)   SpO2 94%   BMI 33.83 kg/m   Physical Exam  Constitutional: He is oriented to person, place, and time and well-developed, well-nourished, and in no distress. No distress.  HENT:  Right Ear: Tympanic membrane normal.  Left Ear: Tympanic membrane is bulging.  Nose: Mucosal edema and rhinorrhea present. Right sinus exhibits no maxillary sinus tenderness and no frontal sinus tenderness. Left sinus exhibits maxillary sinus tenderness. Left sinus exhibits no frontal sinus tenderness.  Cardiovascular: Normal rate, regular rhythm and normal heart sounds.   Pulmonary/Chest: Effort normal. He has no wheezes. He has rhonchi in the right lower field. He has no rales.  Neurological: He is alert and oriented to person, place, and time. GCS score is 15.  Skin: Skin is warm and dry.  Psychiatric: Mood, memory, affect and judgment normal.  Vitals reviewed.   Dg Chest 2 View  Result Date: 05/23/2016 CLINICAL DATA:  Followup persistent cough. EXAM: CHEST  2 VIEW COMPARISON:  05/17/2016.  04/12/2011.  FINDINGS: Heart size is normal. Aortic atherosclerosis again noted. There is linear scar atelectasis in the lower lobes. At the this is slightly improved when compared to the study of 6 days ago. No worsening or new findings. No effusions. No significant bone finding. IMPRESSION: Persistent but slightly improved linear atelectasis/ scarring in the lower lungs. Electronically Signed   By: Paulina Fusi M.D.   On: 05/23/2016 15:45   Results for orders placed or performed in visit on 05/23/16  POCT CBC  Result Value Ref Range   WBC 6.3 4.6 - 10.2 K/uL   Lymph, poc 1.3 0.6 - 3.4   POC LYMPH PERCENT 20.3 10 - 50 %L   MID (cbc) 0.5 0 - 0.9   POC MID % 7.3 0 - 12 %M   POC Granulocyte 4.6 2 - 6.9   Granulocyte percent 72.4 37 - 80 %G   RBC 5.09 4.69 - 6.13 M/uL   Hemoglobin 15.5 14.1 - 18.1 g/dL   HCT, POC 16.1 09.6 - 53.7 %   MCV 86.0 80 - 97 fL   MCH, POC 30.5 27 - 31.2 pg   MCHC 35.5 (A) 31.8 - 35.4 g/dL   RDW, POC 04.5 %   Platelet Count, POC 231 142 - 424 K/uL   MPV 7.7 0 - 99.8 fL    Assessment and Plan :  1. Cough 2. Sinus pressure 3. Nasal drainage 4. Ethmoid sinusitis, unspecified chronicity - DG Chest 2 View; Future - POCT CBC - fluticasone (FLONASE) 50 MCG/ACT nasal spray; Place 2 sprays into both nostrils daily.  Dispense: 16 g; Refill: 6 - predniSONE (DELTASONE) 10 MG tablet; Take 3 PO QAM x2days, 2 PO QAM x2days, 1 PO QAM x2days  Dispense: 12 tablet; Refill: 0 - amoxicillin-clavulanate (AUGMENTIN) 875-125 MG tablet; Take 1 tablet by mouth 2 (two) times daily.  Dispense: 20 tablet; Refill: 0 -  Suspect sinusitis secondary to viral illness. Take prednisone taper first, if no improvement start antibiotics. RTC if no improvement.   Marco Collie, PA-C  Primary Care at [facility] 05/23/2016 2:58 PM

## 2016-05-23 NOTE — Patient Instructions (Addendum)
Place a humidifier in your room at night.  OTC Delsym is helpful for cough Flonase - 2 sprays each nostril at night before bed.  I will call you with your results when they come back.   Thank you for coming in today. I hope you feel we met your needs.  Feel free to call [facility] if you have any questions or further requests.  Please consider signing up for MyChart if you do not already have it, as this is a great way to communicate with me.  Best,  Whitney McVey, PA-C  IF you received an x-ray today, you will receive an invoice from [facility]. Please contact [facility] at [phone] with questions or concerns regarding your invoice.   IF you received labwork today, you will receive an invoice from [facility]. Please contact [facility] at [phone] with questions or concerns regarding your invoice.   Our billing staff will not be able to assist you with questions regarding bills from these companies.  You will be contacted with the lab results as soon as they are available. The fastest way to get your results is to activate your My Chart account. Instructions are located on the last page of this paperwork. If you have not heard from [place] regarding the results in 2 weeks, please contact this office.

## 2016-09-21 ENCOUNTER — Ambulatory Visit (INDEPENDENT_AMBULATORY_CARE_PROVIDER_SITE_OTHER): Payer: BC Managed Care – PPO | Admitting: Physician Assistant

## 2016-09-21 ENCOUNTER — Encounter: Payer: Self-pay | Admitting: Physician Assistant

## 2016-09-21 ENCOUNTER — Ambulatory Visit: Payer: BC Managed Care – PPO | Admitting: Physician Assistant

## 2016-09-21 VITALS — BP 146/88 | HR 64 | Temp 98.6°F | Resp 16 | Ht 70.5 in | Wt 242.1 lb

## 2016-09-21 DIAGNOSIS — L299 Pruritus, unspecified: Secondary | ICD-10-CM | POA: Diagnosis not present

## 2016-09-21 DIAGNOSIS — W57XXXA Bitten or stung by nonvenomous insect and other nonvenomous arthropods, initial encounter: Secondary | ICD-10-CM | POA: Diagnosis not present

## 2016-09-21 MED ORDER — DOXYCYCLINE HYCLATE 100 MG PO CAPS: 100.0000 mg | ORAL_CAPSULE | Freq: Two times a day (BID) | ORAL | 0 refills | Status: DC

## 2016-09-21 NOTE — Progress Notes (Addendum)
   Stephen Cannon  MRN: [id number] DOB: 01/16/1956  Subjective:  Stephen Cannon is a 61 y.o. male seen in office today for a chief complaint of tick in his navel x 2 days. He was out in the woods two days ago and found it after this. He had 4 others crawling on him. Has associated itching in his navel and surrounding redness where it is attached. Has tried peroxide but it did not work. Denies fever, chills, rash,headache, sore throat, joint pain, and myalgias.   Review of Systems  Per HPI  Patient Active Problem List   Diagnosis Date Noted  . Allergic rhinitis due to pollen 06/27/2014  . 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 Primary snoring 06/27/2014  . Snoring 05/31/2014  . Low testosterone 01/03/2014  . Allergic conjunctivitis 01/29/2012  . Hypercholesterolemia 08/01/2011    Current Outpatient Prescriptions on File Prior to Visit  Medication Sig Dispense Refill  . aspirin 81 MG tablet Take 81 mg by mouth daily.    Marland Kitchen. co-enzyme Q-10 30 MG capsule Take 30 mg by mouth daily.     . Multiple Vitamin (MULTIVITAMIN) tablet Take 1 tablet by mouth daily.    Marland Kitchen. UNABLE TO FIND Take 1 capsule by mouth. Med Name:Tumeric cap    . pravastatin (PRAVACHOL) 40 MG tablet TAKE 1 TABLET IN THE P.M.    "OFFICE VISIT NEEDED FOR REFILLS" (Patient not taking: Reported on 09/21/2016) 30 tablet 0   No current facility-administered medications on file prior to visit.     No Known Allergies   Objective:  BP (!) 146/88   Pulse 64   Temp 98.6 F (37 C) (Oral)   Resp 16   Ht 5' 10.5" (1.791 m)   Wt 242 lb 2 oz (109.8 kg)   SpO2 97%   BMI 34.25 kg/m   Physical Exam  Constitutional: He is oriented to person, place, and time and well-developed, well-nourished, and in no distress.  HENT:  Head: Normocephalic and atraumatic.  Eyes: Conjunctivae are normal.  Neck: Normal range of motion.  Pulmonary/Chest: Effort normal.  Abdominal:    Neurological: He is alert and oriented to person, place, and time. Gait  normal.  Skin: Skin is warm and dry.  Psychiatric: Affect normal.  Vitals reviewed.  Procedure Note: Tick Removal Area cleansed with alcohol pad. Intact tick successfully removed with alligator forceps. Area recleansed with alcohol pad. Pt tolerated well. Tick placed in specimen cup and given to patient, per his request.   Assessment and Plan :  1. Itching 2. Tick bite, initial encounter Tick successfully removed. Pt otherwise asymptomatic Will tx prophylactically with doxycycline. Given him educational material on tick bites. Instructed to return to clinic if he develops any new concerning symptoms.  - doxycycline (VIBRAMYCIN) 100 MG capsule; Take 1 capsule (100 mg total) by mouth 2 (two) times daily.  Dispense: 20 capsule; Refill: 0  Benjiman CoreBrittany Davaun Quintela, PA-C  Primary Care at [facility] 09/21/2016 11:31 AM

## 2016-09-21 NOTE — Patient Instructions (Addendum)
Take antibiotic as prescribed. Make sure you use sunscreen while on doxycycline as it can increase your risk for sunburn. Please return to office if you develop any concerning symptoms. Below is info to read about tick bites.  Also, make sure you come back for a cholesterol visit. Make sure you are fasting at this visit (i.e., do not eat for 8 hours prior to this visit). Thank you for letting me participate in your health and well being.    IF you received an x-ray today, you will receive an invoice from [facility]. Please contact [facility] at [phone] with questions or concerns regarding your invoice.   IF you received labwork today, you will receive an invoice from [facility]. Please contact [facility] at [phone] with questions or concerns regarding your invoice.   Our billing staff will not be able to assist you with questions regarding bills from these companies.  You will be contacted with the lab results as soon as they are available. The fastest way to get your results is to activate your My Chart account. Instructions are located on the last page of this paperwork. If you have not heard from us regarding the results in 2 weeks, please contact this office.    ti Tick Bite Information Introduction Ticks are insects that attach themselves to the skin. There are many types of ticks. Common types include wood ticks and deer ticks. Sometimes, ticks carry diseases that can make a person very ill. The most common places for ticks to attach themselves are the scalp, neck, armpits, waist, and groin. HOW CAN YOU PREVENT TICK BITES? Take these steps to help prevent tick bites when you are outdoors:  Wear long sleeves and long pants.  Wear white clothes so you can see ticks more easily.  Tuck your pant legs into your socks.  If walking on a trail, stay in the middle of the trail to avoid brushing against bushes.  Avoid walking through areas with long grass.  Put  bug spray on all skin that is showing and along boot tops, pant legs, and sleeve cuffs.  Check clothes, hair, and skin often and before going inside.  Brush off any ticks that are not attached.  Take a shower or bath as soon as possible after being outdoors.  HOW SHOULD YOU REMOVE A TICK? Ticks should be removed as soon as possible to help prevent diseases. 1. If latex gloves are available, put them on before trying to remove a tick. 2. Use tweezers to grasp the tick as close to the skin as possible. You may also use curved forceps or a tick removal tool. Grasp the tick as close to its head as possible. Avoid grasping the tick on its body. 3. Pull gently upward until the tick lets go. Do not twist the tick or jerk it suddenly. This may break off the tick's head or mouth parts. 4. Do not squeeze or crush the tick's body. This could force disease-carrying fluids from the tick into your body. 5. After the tick is removed, wash the bite area and your hands with soap and water or alcohol. 6. Apply a small amount of antiseptic cream or ointment to the bite site. 7. Wash any tools that were used.  Do not try to remove a tick by applying a hot match, petroleum jelly, or fingernail polish to the tick. These methods do not work. They may also increase the chances of disease being spread from the tick bite. WHEN SHOULD YOU  SEEK HELP? Contact your health care provider if you are unable to remove a tick or if a part of the tick breaks off in the skin. After a tick bite, you need to watch for signs and symptoms of diseases that can be spread by ticks. Contact your health care provider if you develop any of the following:  Fever.  Rash.  Redness and puffiness (swelling) in the area of the tick bite.  Tender, puffy lymph glands.  Watery poop (diarrhea).  Weight loss.  Cough.  Feeling more tired than normal (fatigue).  Muscle, joint, or bone pain.  Belly (abdominal)  pain.  Headache.  Change in your level of consciousness.  Trouble walking or moving your legs.  Loss of feeling (numbness) in the legs.  Loss of movement (paralysis).  Shortness of breath.  Confusion.  Throwing up (vomiting) many times.  This information is not intended to replace advice given to you by your health care provider. Make sure you discuss any questions you have with your health care provider. Document Released: 06/19/2009 Document Revised: 08/31/2015 Document Reviewed: 09/02/2012 Elsevier Interactive Patient Education  [employer].

## 2017-01-18 ENCOUNTER — Ambulatory Visit (INDEPENDENT_AMBULATORY_CARE_PROVIDER_SITE_OTHER): Payer: BC Managed Care – PPO | Admitting: Family Medicine

## 2017-01-18 ENCOUNTER — Encounter: Payer: Self-pay | Admitting: Family Medicine

## 2017-01-18 VITALS — BP 120/82 | HR 74 | Temp 97.8°F | Resp 16 | Ht 72.05 in | Wt 235.0 lb

## 2017-01-18 DIAGNOSIS — Z85828 Personal history of other malignant neoplasm of skin: Secondary | ICD-10-CM

## 2017-01-18 DIAGNOSIS — Z131 Encounter for screening for diabetes mellitus: Secondary | ICD-10-CM

## 2017-01-18 DIAGNOSIS — Z1159 Encounter for screening for other viral diseases: Secondary | ICD-10-CM

## 2017-01-18 DIAGNOSIS — Z125 Encounter for screening for malignant neoplasm of prostate: Secondary | ICD-10-CM

## 2017-01-18 DIAGNOSIS — Z13220 Encounter for screening for lipoid disorders: Secondary | ICD-10-CM

## 2017-01-18 DIAGNOSIS — Z114 Encounter for screening for human immunodeficiency virus [HIV]: Secondary | ICD-10-CM | POA: Diagnosis not present

## 2017-01-18 DIAGNOSIS — Z0000 Encounter for general adult medical examination without abnormal findings: Secondary | ICD-10-CM

## 2017-01-18 DIAGNOSIS — R350 Frequency of micturition: Secondary | ICD-10-CM | POA: Diagnosis not present

## 2017-01-18 DIAGNOSIS — Z8739 Personal history of other diseases of the musculoskeletal system and connective tissue: Secondary | ICD-10-CM | POA: Diagnosis not present

## 2017-01-18 DIAGNOSIS — Z87763 Personal history of other (corrected) congenital abdominal wall malformations: Secondary | ICD-10-CM

## 2017-01-18 DIAGNOSIS — Z23 Encounter for immunization: Secondary | ICD-10-CM | POA: Diagnosis not present

## 2017-01-18 DIAGNOSIS — Z130 Encounter for screening for diseases of the blood and blood-forming organs and certain disorders involving the immune mechanism: Secondary | ICD-10-CM

## 2017-01-18 LAB — POCT URINALYSIS DIP (MANUAL ENTRY)
BILIRUBIN UA: NEGATIVE
BILIRUBIN UA: NEGATIVE mg/dL
Glucose, UA: NEGATIVE mg/dL
LEUKOCYTES UA: NEGATIVE
Nitrite, UA: NEGATIVE
PH UA: 6.5 (ref 5.0–8.0)
PROTEIN UA: NEGATIVE mg/dL
SPEC GRAV UA: 1.01 (ref 1.010–1.025)
Urobilinogen, UA: 0.2 E.U./dL

## 2017-01-18 NOTE — Patient Instructions (Addendum)
For R hip issues, keep follow up with Dr. [name].  If surgery is planned, and preoperative clearance is needed - please follow up for a preoperative clearance.   Call Dr. Matthias Hughs to determine when next colonoscopy is due.   I would recommend following up with [facility] for routine skin cancer screening. Let me know if referral is needed.   Call eye care provider to schedule appointment.   I will check labs, but return to discuss frequent urination in next 1 month.   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   Return to the clinic or go to the nearest emergency room if any of your symptoms worsen or new symptoms occur.  Thanks for coming in today.  Urinary Frequency, Adult Urinary frequency means urinating more often than usual. People with urinary frequency urinate at least 8 times in 24 hours, even if they drink a normal amount of fluid. Although they urinate more often than normal, the total amount of urine produced in a day may be normal. Urinary frequency is also called pollakiuria. What are the causes? This condition may be caused by:  A urinary tract infection.  Obesity.  Bladder problems, such as bladder stones.  Caffeine or alcohol.  Eating food or drinking fluids that irritate the bladder. These include coffee, tea, soda, artificial sweeteners, citrus, tomato-based foods, and chocolate.  Certain medicines, such as medicines that help the body get rid of extra fluid (diuretics).  Muscle or nerve weakness.  Overactive bladder.  Chronic diabetes.  Interstitial cystitis.  In men, problems with the prostate, such as an enlarged prostate.  In women, pregnancy.  In some cases, the cause may not be known. What increases the risk? This condition is more likely to develop in:  Women who have gone through menopause.  Men with prostate  problems.  People with a disease or injury that affects the nerves or spinal cord.  People who have or have had a condition that affects the brain, such as a stroke.  What are the signs or symptoms? Symptoms of this condition include:  Feeling an urgent need to urinate often. The stress and anxiety of needing to find a bathroom quickly can make this urge worse.  Urinating 8 or more times in 24 hours.  Urinating as often as every 1 to 2 hours.  How is this diagnosed? This condition is diagnosed based on your symptoms, your medical history, and a physical exam. You may have tests, such as:  Blood tests.  Urine tests.  Imaging tests, such as X-rays or ultrasounds.  A bladder test.  A test of your neurological system. This is the body system that senses the need to urinate.  A test to check for problems in the urethra and bladder called cystoscopy.  You may also be asked to keep a bladder diary. A bladder diary is a record of what you eat and drink, how often you urinate, and how much you urinate. You may need to see a health care provider who specializes in conditions of the urinary tract (urologist) or kidneys (nephrologist). How is this treated? Treatment for this condition depends on the cause. Sometimes the condition goes away on its own and treatment is not necessary. If treatment is needed, it may include:  Taking medicine.  Learning exercises that strengthen the muscles that help control urination.  Following a bladder training program. This may include: ? Learning to delay going to the bathroom. ? Double urinating (voiding). This helps if you are not completely emptying your bladder. ? Scheduled voiding.  Making diet changes, such as: ? Avoiding caffeine. ? Drinking fewer fluids, especially alcohol. ? Not drinking in the evening. ? Not having foods or drinks that may irritate the bladder. ? Eating foods that help prevent or ease constipation. Constipation can make  this condition worse.  Having the nerves in your bladder stimulated. There are two options for stimulating the nerves to your bladder: ? Outpatient electrical nerve stimulation. This is done by your health care provider. ? Surgery to implant a bladder pacemaker. The pacemaker helps to control the urge to urinate.  Follow these instructions at home:  Keep a bladder diary if told to by your health care provider.  Take over-the-counter and prescription medicines only as told by your health care provider.  Do any exercises as told by your health care provider.  Follow a bladder training program as told by your health care provider.  Make any recommended diet changes.  Keep all follow-up visits as told by your health care provider. This is important. Contact a health care provider if:  You start urinating more often.  You feel pain or irritation when you urinate.  You notice blood in your urine.  Your urine looks cloudy.  You develop a fever.  You begin vomiting. Get help right away if:  You are unable to urinate. This information is not intended to replace advice given to you by your health care provider. Make sure you discuss any questions you have with your health care provider. Document Released: 01/19/2009 Document Revised: 04/26/2015 Document Reviewed: 10/19/2014 Elsevier Interactive Patient Education  2018 [employer].   Keeping you healthy  Get these tests  Blood pressure- Have your blood pressure checked once a year by your healthcare provider.  Normal blood pressure is 120/80  Weight- Have your body mass index (BMI) calculated to screen for obesity.  BMI is a measure of body fat based on height and weight. You can also calculate your own BMI at [email or web address].  Cholesterol- Have your cholesterol checked every year.  Diabetes- Have your blood sugar checked regularly if you have high blood pressure, high cholesterol, have a family history of diabetes  or if you are overweight.  Screening for Colon Cancer- Colonoscopy starting at age 84.  Screening may begin sooner depending on your family history and other health conditions. Follow up colonoscopy as directed by your Gastroenterologist.  Screening for Prostate Cancer- Both blood work (PSA) and a rectal exam help screen for Prostate Cancer.  Screening begins at age 51 with African-American men and at age 74 with Caucasian men.  Screening may begin sooner depending on your family history.  Take these medicines  Aspirin- One aspirin daily can help prevent Heart disease and Stroke.  Flu shot- Every fall.  Tetanus- Every 10 years.  Zostavax- Once after the age of 29 to prevent Shingles.  Pneumonia shot- Once after the age of 32; if you are younger than 44, ask your healthcare provider if you need a Pneumonia shot.  Take these steps  Don't smoke- If you do smoke, talk to your doctor about quitting.  For tips on how to quit, go to www.smokefree.gov or call 1-800-QUIT-NOW.  Be physically active- Exercise 5 days a week for at least 30 minutes.  If you are not already physically active start slow and  gradually work up to 30 minutes of moderate physical activity.  Examples of moderate activity include walking briskly, mowing the yard, dancing, swimming, bicycling, etc.Pool based exercise may be easier with your hip issues.   Eat a healthy diet- Eat a variety of healthy food such as fruits, vegetables, low fat milk, low fat cheese, yogurt, lean meant, poultry, fish, beans, tofu, etc. For more information go to www.thenutritionsource.org  Drink alcohol in moderation- Limit alcohol intake to less than two drinks a day. Never drink and drive.  Dentist- Brush and floss twice daily; visit your dentist twice a year.  Depression- Your emotional health is as important as your physical health. If you're feeling down, or losing interest in things you would normally enjoy please talk to your healthcare  provider.  Eye exam- Visit your eye doctor every year.  Safe sex- If you may be exposed to a sexually transmitted infection, use a condom.  Seat belts- Seat belts can save your life; always wear one.  Smoke/Carbon Monoxide detectors- These detectors need to be installed on the appropriate level of your home.  Replace batteries at least once a year.  Skin cancer- When out in the sun, cover up and use sunscreen 15 SPF or higher.  Violence- If anyone is threatening you, please tell your healthcare provider.  Living Will/ Health care power of attorney- Speak with your healthcare provider and family.    IF you received an x-ray today, you will receive an invoice from [facility]. Please contact [facility] at [phone] with questions or concerns regarding your invoice.   IF you received labwork today, you will receive an invoice from [facility]. Please contact [facility] at [phone] with questions or concerns regarding your invoice.   Our billing staff will not be able to assist you with questions regarding bills from these companies.  You will be contacted with the lab results as soon as they are available. The fastest way to get your results is to activate your My Chart account. Instructions are located on the last page of this paperwork. If you have not heard from [place] regarding the results in 2 weeks, please contact this office.

## 2017-01-18 NOTE — Progress Notes (Signed)
   Subjective:    Patient ID: Stephen Cannon, male    DOB: 1955-11-03, 61 y.o.   MRN: [id number]  HPI    Review of Systems  Eyes: Positive for redness and visual disturbance.  Endocrine: Positive for polyuria.  Musculoskeletal: Positive for arthralgias and back pain.  Allergic/Immunologic: Positive for environmental allergies.       Objective:   Physical Exam        Assessment & Plan:

## 2017-01-18 NOTE — Progress Notes (Signed)
Subjective:  By signing my name below, I, [name], attest that this documentation has been prepared under the direction and in the presence of [name], MD Electronically Signed: Charline Bills, ED Scribe 01/18/2017 at 9:38 AM.   Patient ID: Stephen Cannon, male    DOB: 1955/10/01, 61 y.o.   MRN: [id number]  Chief Complaint  Patient presents with  . Annual Exam   HPI  Stephen Cannon is a 61 y.o. male who presents to Primary Care at [facility] for an annual exam. H/o hyperlipidemia, low testosterone, seasonal allergies. I last saw him in February 2017 for acute illness.   Allergies Has been seen by Dr. Jearld Fenton with ENT recurrent sinusitis. Has been treated with Flonase previously. Sinus surgery was discussed. Has a split sleep study in 2016 indicating mild OSA with AHI 5.7. RDI 9.4 per hour. Treatment with dental device or ENT advised as well as weight loss. Pt states that he had sinus surgery with instatrak done in Fall 2017 by Dr. Jearld Fenton which has improved recurrent sinusitis significantly.    R Hip Pain Pt is supposed to see Dr. [name] [facility] on 10/19 for R hip pain to discuss possibility of surgery. He has had XRs obtained by Dr. Ranell Patrick. Pt states that he takes 3-4 Advil tablets at breakfast, 2 extra strength Tylenol tablets at lunch and more medication for pain around 5 PM. Pt has had an injection done 3 weeks ago.  Urinary Frequency  Pt reports urinary frequency for the past 2 years. States he has been drinking more water lately. Denies hematuria, h/o elevated blood sugar. Has been seen by Dr. Mena Goes in November 2015 at [facility]. Pt had microscopic hematuria and a CT that showed a 35 gram prostate. Pt had frequency and urgency at that time. Discussed caffeine intake and cystoscopy was declined.   Abdomen Pt has noticed a raised area to his upper abdomen with lying down and doing crunches. He states that the area self-resolves with sitting upright. Pt  denies abdominal pain.   CA Screening Colonoscopy: Pt had a colonoscopy by Dr. Matthias Hughs at age 16 Skin CA: Has had skin CA removed from the nose at [facility]. Has not seen dermatology within the past year. Prostate CA:  Lab Results  Component Value Date   PSA 1.80 10/24/2013   Immunizations  There is no immunization history on file for this patient. Tetanus: last tdap was 9 years ago Flu: pt will receive flu vaccine on 10/24 at work HIV Screening: agrees to screening at this visit Hep C: agrees to screening at this visit  Depression Screening Depression screen [facility] 2/9 01/18/2017 09/21/2016 05/23/2016 05/17/2016 05/15/2016  Decreased Interest 0 0 0 0 0  Down, Depressed, Hopeless 0 0 0 0 0  PHQ - 2 Score 0 0 0 0 0     Visual Acuity Screening   Right eye Left eye Both eyes  Without correction:  With correction:      Vision: has not seen eye doctor in a while but does report some burning of retina. Previously seen by Dr. Dione Booze Dentist: sees Dr. Jon Billings regularly Exercise: not exercising regularly due to hip pain  Patient Active Problem List   Diagnosis Date Noted  . Allergic rhinitis due to pollen 06/27/2014  . 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 Primary snoring 06/27/2014  . Snoring 05/31/2014  . Low testosterone 01/03/2014  . Allergic conjunctivitis 01/29/2012  . Hypercholesterolemia 08/01/2011   History reviewed. No pertinent  past medical history. Past Surgical History:  Procedure Laterality Date  . APPENDECTOMY    . SINUS SURGERY WITH INSTATRAK     No Known Allergies Prior to Admission medications   Medication Sig Start Date End Date Taking? Authorizing Provider  aspirin 81 MG tablet Take 81 mg by mouth daily.    [provider]  co-enzyme Q-10 30 MG capsule Take 30 mg by mouth daily.     [provider]  doxycycline (VIBRAMYCIN) 100 MG capsule Take 1 capsule (100 mg total) by mouth 2 (two) times daily. 09/21/16   Benjiman Core D, PA-C  Multiple Vitamin (MULTIVITAMIN) tablet Take 1 tablet by mouth daily.    [provider]  pravastatin (PRAVACHOL) 40 MG tablet TAKE 1 TABLET IN THE P.M.    "OFFICE VISIT NEEDED FOR REFILLS" Patient not taking: Reported on 09/21/2016 01/29/15   Collene Gobble, MD  UNABLE TO FIND Take 1 capsule by mouth. Med Name:Tumeric cap    [provider]   Social History   Social History  . Marital status: Married    Spouse name: N/A  . Number of children: N/A  . Years of education: N/A   Occupational History  . Not on file.   Social History Main Topics  . Smoking status: Former Smoker    Quit date: 04/08/1981  . Smokeless tobacco: Never Used  . Alcohol use No  . Drug use: No  . Sexual activity: Yes   Other Topics Concern  . Not on file   Social History Narrative   Caffeine 2 cups daily avg.   Review of Systems  Gastrointestinal: Negative for abdominal pain.  Genitourinary: Positive for frequency. Negative for hematuria.  Musculoskeletal: Positive for arthralgias (R hip).  See nursing note    Objective:   Physical Exam  Constitutional: He is oriented to person, place, and time. He appears well-developed and well-nourished.  HENT:  Head: Normocephalic and atraumatic.  Right Ear: External ear normal.  Left Ear: External ear normal.  Mouth/Throat: Oropharynx is clear and moist.  Eyes: Pupils are equal, round, and reactive to light. Conjunctivae and EOM are normal.  Neck: Normal range of motion. Neck supple. No thyromegaly present.  Cardiovascular: Normal rate, regular rhythm, normal heart sounds and intact distal pulses.   Pulmonary/Chest: Effort normal and breath sounds normal. No respiratory distress. He has no wheezes.  Abdominal: Soft. He exhibits no distension. There is no tenderness. No hernia. Hernia confirmed negative in the right inguinal area and confirmed negative in the left inguinal area.  Possible rectus diastasis but no umbilical  hernia.  Genitourinary: Prostate normal.  Musculoskeletal: Normal range of motion. He exhibits no edema or tenderness.  Decreased internal rotation of R hip.  Lymphadenopathy:    He has no cervical adenopathy.  Neurological: He is alert and oriented to person, place, and time. He has normal reflexes.  Skin: Skin is warm and dry.  Psychiatric: He has a normal mood and affect. His behavior is normal.  Vitals reviewed.  Vitals:   01/18/17 0919  BP: 120/82  Pulse: 74  Resp: 16  Temp: 97.8 F (36.6 C)  TempSrc: Oral  SpO2: 96%  Weight: 235 lb (106.6 kg)  Height: 6' 0.05" (1.83 m)   Results for orders placed or performed in visit on 01/18/17  POCT urinalysis dipstick  Result Value Ref Range   Color, UA yellow yellow   Clarity, UA clear clear   Glucose, UA negative negative mg/dL   Bilirubin,  UA negative negative   Ketones, POC UA negative negative mg/dL   Spec Grav, UA 8.295 6.213 - 1.025   Blood, UA trace-intact (A) negative   pH, UA 6.5 5.0 - 8.0   Protein Ur, POC negative negative mg/dL   Urobilinogen, UA 0.2 0.2 or 1.0 E.U./dL   Nitrite, UA Negative Negative   Leukocytes, UA Negative Negative      Assessment & Plan:   Seeley Hissong is a 61 y.o. male Annual physical exam  - -anticipatory guidance as below in AVS, screening labs above. Health maintenance items as above in HPI discussed/recommended as applicable.   Frequent urination - Plan: Comprehensive metabolic panel, Hemoglobin A1c, PSA, POCT urinalysis dipstick  - Recurrent issue, urinalysis reassuring, check PSA, follow-up to discuss further in next few weeks.  Screening for prostate cancer - Plan: PSA  -We discussed pros and cons of prostate cancer screening, and after this discussion, he chose to have screening done. PSA obtained, and no concerning findings on DRE.   Screening for diabetes mellitus - Plan: Comprehensive metabolic panel, Hemoglobin A1c  Screening, anemia, deficiency, iron - Plan: CBC with  Differential/Platelet  Screening for hyperlipidemia - Plan: Lipid panel  Need for Tdap vaccination - Plan: Tdap vaccine greater than or equal to 7yo IM  History of skin cancer  -Recommended follow-up with dermatologist. Let me know if referral needed  Encounter for screening for HIV - Plan: HIV antibody  Encounter for hepatitis C screening test for low risk patient - Plan: Hepatitis C antibody  History of defect of abdominal wall - Plan: Ambulatory referral to General Surgery  -Possible rectus diastasis, but will have second opinion with general surgery to rule out ventral wall hernia.  If preoperative clearance needed for hip surgery, advised to schedule separate appointment  No orders of the defined types were placed in this encounter.  Patient Instructions   For R hip issues, keep follow up with Dr. [name].  If surgery is planned, and preoperative clearance is needed - please follow up for a preoperative clearance.   Call Dr. Matthias Hughs to determine when next colonoscopy is due.   I would recommend following up with [facility] for routine skin cancer screening. Let me know if referral is needed.   Call eye care provider to schedule appointment.   I will check labs, but return to discuss frequent urination in next 1 month.   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   Return to the clinic or go to the nearest emergency room if any of your symptoms worsen or new symptoms occur.  Thanks for coming in today.  Urinary Frequency, Adult Urinary frequency means urinating more often than usual. People with urinary frequency urinate at least 8 times in 24 hours, even if they drink a normal amount of fluid. Although they urinate more often than normal, the total amount of urine produced in a day may be normal. Urinary frequency is also called pollakiuria. What are the  causes? This condition may be caused by:  A urinary tract infection.  Obesity.  Bladder problems, such as bladder stones.  Caffeine or alcohol.  Eating food or drinking fluids that irritate the bladder. These include coffee, tea, soda, artificial sweeteners, citrus, tomato-based foods, and chocolate.  Certain medicines, such as medicines that help the body get rid of extra fluid (diuretics).  Muscle  or nerve weakness.  Overactive bladder.  Chronic diabetes.  Interstitial cystitis.  In men, problems with the prostate, such as an enlarged prostate.  In women, pregnancy.  In some cases, the cause may not be known. What increases the risk? This condition is more likely to develop in:  Women who have gone through menopause.  Men with prostate problems.  People with a disease or injury that affects the nerves or spinal cord.  People who have or have had a condition that affects the brain, such as a stroke.  What are the signs or symptoms? Symptoms of this condition include:  Feeling an urgent need to urinate often. The stress and anxiety of needing to find a bathroom quickly can make this urge worse.  Urinating 8 or more times in 24 hours.  Urinating as often as every 1 to 2 hours.  How is this diagnosed? This condition is diagnosed based on your symptoms, your medical history, and a physical exam. You may have tests, such as:  Blood tests.  Urine tests.  Imaging tests, such as X-rays or ultrasounds.  A bladder test.  A test of your neurological system. This is the body system that senses the need to urinate.  A test to check for problems in the urethra and bladder called cystoscopy.  You may also be asked to keep a bladder diary. A bladder diary is a record of what you eat and drink, how often you urinate, and how much you urinate. You may need to see a health care provider who specializes in conditions of the urinary tract (urologist) or kidneys  (nephrologist). How is this treated? Treatment for this condition depends on the cause. Sometimes the condition goes away on its own and treatment is not necessary. If treatment is needed, it may include:  Taking medicine.  Learning exercises that strengthen the muscles that help control urination.  Following a bladder training program. This may include: ? Learning to delay going to the bathroom. ? Double urinating (voiding). This helps if you are not completely emptying your bladder. ? Scheduled voiding.  Making diet changes, such as: ? Avoiding caffeine. ? Drinking fewer fluids, especially alcohol. ? Not drinking in the evening. ? Not having foods or drinks that may irritate the bladder. ? Eating foods that help prevent or ease constipation. Constipation can make this condition worse.  Having the nerves in your bladder stimulated. There are two options for stimulating the nerves to your bladder: ? Outpatient electrical nerve stimulation. This is done by your health care provider. ? Surgery to implant a bladder pacemaker. The pacemaker helps to control the urge to urinate.  Follow these instructions at home:  Keep a bladder diary if told to by your health care provider.  Take over-the-counter and prescription medicines only as told by your health care provider.  Do any exercises as told by your health care provider.  Follow a bladder training program as told by your health care provider.  Make any recommended diet changes.  Keep all follow-up visits as told by your health care provider. This is important. Contact a health care provider if:  You start urinating more often.  You feel pain or irritation when you urinate.  You notice blood in your urine.  Your urine looks cloudy.  You develop a fever.  You begin vomiting. Get help right away if:  You are unable to urinate. This information is not intended to replace advice given to you by your health care provider.  Make sure you discuss any questions you have with your health care provider. Document Released: 01/19/2009 Document Revised: 04/26/2015 Document Reviewed: 10/19/2014 Elsevier Interactive Patient Education  2018 [employer].   Keeping you healthy  Get these tests  Blood pressure- Have your blood pressure checked once a year by your healthcare provider.  Normal blood pressure is 120/80  Weight- Have your body mass index (BMI) calculated to screen for obesity.  BMI is a measure of body fat based on height and weight. You can also calculate your own BMI at [email or web address].  Cholesterol- Have your cholesterol checked every year.  Diabetes- Have your blood sugar checked regularly if you have high blood pressure, high cholesterol, have a family history of diabetes or if you are overweight.  Screening for Colon Cancer- Colonoscopy starting at age 74.  Screening may begin sooner depending on your family history and other health conditions. Follow up colonoscopy as directed by your Gastroenterologist.  Screening for Prostate Cancer- Both blood work (PSA) and a rectal exam help screen for Prostate Cancer.  Screening begins at age 25 with African-American men and at age 70 with Caucasian men.  Screening may begin sooner depending on your family history.  Take these medicines  Aspirin- One aspirin daily can help prevent Heart disease and Stroke.  Flu shot- Every fall.  Tetanus- Every 10 years.  Zostavax- Once after the age of 72 to prevent Shingles.  Pneumonia shot- Once after the age of 11; if you are younger than 82, ask your healthcare provider if you need a Pneumonia shot.  Take these steps  Don't smoke- If you do smoke, talk to your doctor about quitting.  For tips on how to quit, go to www.smokefree.gov or call 1-800-QUIT-NOW.  Be physically active- Exercise 5 days a week for at least 30 minutes.  If you are not already physically active start slow and gradually work up to 30  minutes of moderate physical activity.  Examples of moderate activity include walking briskly, mowing the yard, dancing, swimming, bicycling, etc.Pool based exercise may be easier with your hip issues.   Eat a healthy diet- Eat a variety of healthy food such as fruits, vegetables, low fat milk, low fat cheese, yogurt, lean meant, poultry, fish, beans, tofu, etc. For more information go to www.thenutritionsource.org  Drink alcohol in moderation- Limit alcohol intake to less than two drinks a day. Never drink and drive.  Dentist- Brush and floss twice daily; visit your dentist twice a year.  Depression- Your emotional health is as important as your physical health. If you're feeling down, or losing interest in things you would normally enjoy please talk to your healthcare provider.  Eye exam- Visit your eye doctor every year.  Safe sex- If you may be exposed to a sexually transmitted infection, use a condom.  Seat belts- Seat belts can save your life; always wear one.  Smoke/Carbon Monoxide detectors- These detectors need to be installed on the appropriate level of your home.  Replace batteries at least once a year.  Skin cancer- When out in the sun, cover up and use sunscreen 15 SPF or higher.  Violence- If anyone is threatening you, please tell your healthcare provider.  Living Will/ Health care power of attorney- Speak with your healthcare provider and family.    IF you received an x-ray today, you will receive an invoice from [facility]. Please contact [facility] at [phone] with questions or concerns regarding your invoice.   IF you received labwork today,  you will receive an invoice from [facility]. Please contact [facility] at [phone] with questions or concerns regarding your invoice.   Our billing staff will not be able to assist you with questions regarding bills from these companies.  You will be contacted with the lab results as soon as they are  available. The fastest way to get your results is to activate your My Chart account. Instructions are located on the last page of this paperwork. If you have not heard from [place] regarding the results in 2 weeks, please contact this office.       I personally performed the services described in this documentation, which was scribed in my presence. The recorded information has been reviewed and considered for accuracy and completeness, addended by me as needed, and agree with information above.  Signed,   [name], MD Primary Care at [facility].  01/20/17 6:26 PM

## 2017-01-19 LAB — COMPREHENSIVE METABOLIC PANEL
A/G RATIO: 2.3 — AB (ref 1.2–2.2)
ALBUMIN: 4.8 g/dL (ref 3.5–5.5)
ALT: 30 IU/L (ref 0–44)
AST: 24 IU/L (ref 0–40)
Alkaline Phosphatase: 67 IU/L (ref 39–117)
BUN / CREAT RATIO: 15 (ref 9–20)
BUN: 14 mg/dL (ref 6–24)
Bilirubin Total: 0.4 mg/dL (ref 0.0–1.2)
CALCIUM: 10 mg/dL (ref 8.7–10.2)
CO2: 24 mmol/L (ref 20–29)
Chloride: 101 mmol/L (ref 96–106)
Creatinine, Ser: 0.92 mg/dL (ref 0.76–1.27)
GFR, EST AFRICAN AMERICAN: 105 mL/min/{1.73_m2} (ref >59)
GFR, EST NON AFRICAN AMERICAN: 91 mL/min/{1.73_m2} (ref >59)
GLOBULIN, TOTAL: 2.1 g/dL (ref 1.5–4.5)
Glucose: 104 mg/dL — ABNORMAL HIGH (ref 65–99)
POTASSIUM: 4.9 mmol/L (ref 3.5–5.2)
SODIUM: 141 mmol/L (ref 134–144)
TOTAL PROTEIN: 6.9 g/dL (ref 6.0–8.5)

## 2017-01-19 LAB — CBC WITH DIFFERENTIAL/PLATELET
BASOS: 1 %
Basophils Absolute: 0 10*3/uL (ref 0.0–0.2)
EOS (ABSOLUTE): 0.2 10*3/uL (ref 0.0–0.4)
EOS: 4 %
HEMATOCRIT: 47.5 % (ref 37.5–51.0)
HEMOGLOBIN: 15.8 g/dL (ref 13.0–17.7)
IMMATURE GRANS (ABS): 0 10*3/uL (ref 0.0–0.1)
IMMATURE GRANULOCYTES: 0 %
LYMPHS: 19 %
Lymphocytes Absolute: 1.1 10*3/uL (ref 0.7–3.1)
MCH: 30.2 pg (ref 26.6–33.0)
MCHC: 33.3 g/dL (ref 31.5–35.7)
MCV: 91 fL (ref 79–97)
Monocytes Absolute: 0.4 10*3/uL (ref 0.1–0.9)
Monocytes: 7 %
NEUTROS PCT: 69 %
Neutrophils Absolute: 3.8 10*3/uL (ref 1.4–7.0)
Platelets: 221 10*3/uL (ref 150–379)
RBC: 5.24 x10E6/uL (ref 4.14–5.80)
RDW: 14 % (ref 12.3–15.4)
WBC: 5.5 10*3/uL (ref 3.4–10.8)

## 2017-01-19 LAB — LIPID PANEL
CHOLESTEROL TOTAL: 224 mg/dL — AB (ref 100–199)
Chol/HDL Ratio: 3.7 ratio (ref 0.0–5.0)
HDL: 60 mg/dL (ref >39)
LDL CALC: 149 mg/dL — AB (ref 0–99)
Triglycerides: 73 mg/dL (ref 0–149)
VLDL CHOLESTEROL CAL: 15 mg/dL (ref 5–40)

## 2017-01-19 LAB — HEMOGLOBIN A1C
Est. average glucose Bld gHb Est-mCnc: 105 mg/dL
Hgb A1c MFr Bld: 5.3 % (ref 4.8–5.6)

## 2017-01-19 LAB — HEPATITIS C ANTIBODY

## 2017-01-19 LAB — PSA: Prostate Specific Ag, Serum: 1.8 ng/mL (ref 0.0–4.0)

## 2017-01-19 LAB — HIV ANTIBODY (ROUTINE TESTING W REFLEX): HIV Screen 4th Generation wRfx: NONREACTIVE

## 2017-01-26 ENCOUNTER — Encounter: Payer: Self-pay | Admitting: Family Medicine

## 2017-01-27 NOTE — Telephone Encounter (Signed)
Replied by lab note.  

## 2017-02-08 ENCOUNTER — Ambulatory Visit: Payer: BC Managed Care – PPO | Admitting: Physician Assistant

## 2017-02-11 ENCOUNTER — Ambulatory Visit: Payer: BC Managed Care – PPO | Admitting: Family Medicine

## 2017-02-11 ENCOUNTER — Encounter: Payer: Self-pay | Admitting: Family Medicine

## 2017-02-11 VITALS — BP 118/66 | HR 84 | Temp 98.3°F | Resp 16 | Ht 72.05 in | Wt 242.6 lb

## 2017-02-11 DIAGNOSIS — N401 Enlarged prostate with lower urinary tract symptoms: Secondary | ICD-10-CM

## 2017-02-11 DIAGNOSIS — R350 Frequency of micturition: Secondary | ICD-10-CM

## 2017-02-11 NOTE — Progress Notes (Signed)
Subjective:    Patient ID: Stephen Cannon, male    DOB: 05/16/1955, 61 y.o.   MRN: [id number]  HPI Stephen LeschesKimball Sylva is a 61 y.o. male Presents today for: Chief Complaint  Patient presents with  . Follow-up    patient states that he was advised to follow up  Seen October 13, multiple issues addressed at that time in addition to his physical. One symptom we discussed was frequent urination, asked that he follow-up to discuss this further. He had a normal white blood cell count, borderline elevated glucose at 104, normal PSA 1.8, trace blood on urinalysis dipstick, but negative nitrite and leukocyte esterase. Urine micro in 10/2013 1-3 RBC.  Still waking up 2 times per night every night - same over past 15 years, but does feel some urgency at times with small amount of urination past year. No fever, no abd pain.   Seen by urology 01/05/14. Had CT with 35 gram prostate (normal 25-30). That have some BPH, minimal symptoms at that time. No recent evaluation with urologist  Patient Active Problem List   Diagnosis Date Noted  . Allergic rhinitis due to pollen 06/27/2014  . 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 Primary snoring 06/27/2014  . Snoring 05/31/2014  . Low testosterone 01/03/2014  . Allergic conjunctivitis 01/29/2012  . Hypercholesterolemia 08/01/2011   No past medical history on file. Past Surgical History:  Procedure Laterality Date  . APPENDECTOMY    . SINUS SURGERY WITH INSTATRAK     No Known Allergies Prior to Admission medications   Medication Sig Start Date End Date Taking? Authorizing Provider  aspirin 81 MG tablet Take 81 mg by mouth daily.    [provider]  co-enzyme Q-10 30 MG capsule Take 30 mg by mouth daily.     [provider]  Multiple Vitamin (MULTIVITAMIN) tablet Take 1 tablet by mouth daily.    [provider]  UNABLE TO FIND Take 1 capsule by mouth. Med Name:Tumeric cap    [provider]   Social History   Socioeconomic  History  . Marital status: Married    Spouse name: Not on file  . Number of children: Not on file  . Years of education: Not on file  . Highest education level: Not on file  Social Needs  . Financial resource strain: Not on file  . Food insecurity - worry: Not on file  . Food insecurity - inability: Not on file  . Transportation needs - medical: Not on file  . Transportation needs - non-medical: Not on file  Occupational History  . Not on file  Tobacco Use  . Smoking status: Former Smoker    Last attempt to quit: 04/08/1981    Years since quitting: 35.8  . Smokeless tobacco: Never Used  Substance and Sexual Activity  . Alcohol use: No    Alcohol/week: 0.0 oz  . Drug use: No  . Sexual activity: Yes  Other Topics Concern  . Not on file  Social History Narrative   Caffeine 2 cups daily avg.    Review of Systems  Gastrointestinal: Negative for abdominal pain.  Genitourinary: Positive for difficulty urinating (As above, but able to produce urine. Denies retention) and frequency (With small amounts at times as above). Negative for dysuria and hematuria.       Objective:   Physical Exam  Constitutional: He is oriented to person, place, and time. He appears well-developed and well-nourished.  HENT:  Head: Normocephalic and atraumatic.  Eyes: EOM are normal.  Pupils are equal, round, and reactive to light.  Neck: No JVD present. Carotid bruit is not present.  Cardiovascular: Normal rate, regular rhythm and normal heart sounds.  No murmur heard. Pulmonary/Chest: Effort normal and breath sounds normal. He has no rales.  Musculoskeletal: He exhibits no edema.  Neurological: He is alert and oriented to person, place, and time.  Skin: Skin is warm and dry.  Psychiatric: He has a normal mood and affect.  Vitals reviewed.  Vitals:   02/11/17 1732  BP: 118/66  Pulse: 84  Resp: 16  Temp: 98.3 F (36.8 C)  TempSrc: Oral  SpO2: 96%  Weight: 242 lb 9.6 oz (110 kg)  Height: 6'  0.05" (1.83 m)       Assessment & Plan:  Stephen LeschesKimball Pounders is a 61 y.o. male Benign prostatic hyperplasia with urinary frequency - Plan: Ambulatory referral to Urology  -History of BPH, reassuring recent PSA. Increase in symptoms recently, will refer back to urology to discuss medication options as well as decide if any further workup needed. RTC precautions if acute worsening  -  Deferred repeat urine testing in office as likely will have that done at urology and denies recent acute infectious symptoms.  Planning on possible surgery, advised to return closer to that surgery date for preoperative evaluation/discussion.  No orders of the defined types were placed in this encounter.  Patient Instructions   I will refer you back to urology as prostate symptoms have increased, and can discuss specific treatments for BPH. Your testing was reassuring last visit. Return to the clinic or go to the nearest emergency room if any of your symptoms worsen or new symptoms occur.  Please return for preoperative evaluation closer to surgery time.  Your surgeon will usually send me a letter to have this done.    IF you received an x-ray today, you will receive an invoice from [facility]. Please contact [facility] at [phone] with questions or concerns regarding your invoice.   IF you received labwork today, you will receive an invoice from [facility]. Please contact [facility] at [phone] with questions or concerns regarding your invoice.   Our billing staff will not be able to assist you with questions regarding bills from these companies.  You will be contacted with the lab results as soon as they are available. The fastest way to get your results is to activate your My Chart account. Instructions are located on the last page of this paperwork. If you have not heard from us regarding the results in 2 weeks, please contact this office.      Signed,   Meredith StaggersJeffrey Briane Birden, MD Primary  Care at [facility].  02/12/17 3:35 PM

## 2017-02-11 NOTE — Patient Instructions (Addendum)
I will refer you back to urology as prostate symptoms have increased, and can discuss specific treatments for BPH. Your testing was reassuring last visit. Return to the clinic or go to the nearest emergency room if any of your symptoms worsen or new symptoms occur.  Please return for preoperative evaluation closer to surgery time.  Your surgeon will usually send me a letter to have this done.    IF you received an x-ray today, you will receive an invoice from [facility]. Please contact [facility] at [phone] with questions or concerns regarding your invoice.   IF you received labwork today, you will receive an invoice from [facility]. Please contact [facility] at [phone] with questions or concerns regarding your invoice.   Our billing staff will not be able to assist you with questions regarding bills from these companies.  You will be contacted with the lab results as soon as they are available. The fastest way to get your results is to activate your My Chart account. Instructions are located on the last page of this paperwork. If you have not heard from us regarding the results in 2 weeks, please contact this office.

## 2017-02-12 ENCOUNTER — Encounter: Payer: Self-pay | Admitting: Family Medicine

## 2017-03-16 ENCOUNTER — Ambulatory Visit: Payer: Self-pay | Admitting: Orthopedic Surgery

## 2017-03-21 ENCOUNTER — Ambulatory Visit: Payer: Self-pay | Admitting: Orthopedic Surgery

## 2017-03-21 NOTE — H&P (Signed)
Stephen Cannon DOB: 03/21/1956 Married / Language: English / Race: White Male Date of Admission:  04/16/2017 CC: Right hip pain History of Present Illness The patient is a 61 year old male who comes in for a preoperative History and Physical. The patient is scheduled for a right total hip arthroplasty (anterior) to be performed by Dr. Gus RankinFrank V. Aluisio, MD at [facility] on 04-16-2017. The patient is a 61 year old male who presented with a hip problem. The patient was seen in referral from Dr. Ranell PatrickNorris. The patient reports right hip problems including pain symptoms that have been present for many months. The symptoms began without any known injury. Symptoms reported include hip pain (groin), weakness, burning, numbness (right lower ext. from the level of the right knee to the foot) and catching. Current treatment includes nonsteroidal anti-inflammatory drugs (Advil) and non-opioid analgesics (Tylenol). Prior to being seen, the patient was previously evaluated by a colleague. Previous workup for this problem has included hip x-rays. Previous treatment for this problem has included corticosteroid injection (IA injection per Dr. Ethelene Halamos 12-25-16 with only 1 week of minimal relief). He has had stiffness in his hip for quite a while but the pain did not get real intense until the past 4 months. It started out groin pain intermittently but now is hurting at all times including at rest and at night. It is waking him up at night. Pain is in the groin and radiates to his knee. He has lost more motion in the past several months. It is now interfering with what he can and cannot do. Hip is essentially taking over his life at this time. He is not having any problems in his LEFT hip. He does not have any lower extremity weakness or paresthesia. He is ready to proceed with surgery. They have been treated conservatively in the past for the above stated problem and despite conservative measures, they continue to  have progressive pain and severe functional limitations and dysfunction. They have failed non-operative management including home exercise, medications, and injections. It is felt that they would benefit from undergoing total joint replacement. Risks and benefits of the procedure have been discussed with the patient and they elect to proceed with surgery. There are no active contraindications to surgery such as ongoing infection or rapidly progressive neurological disease.   Problem List/Past Medical Hip pain (M25.559)  Degenerative lumbar disc (M51.36)  Lumbar spinal stenosis (M48.07)  Primary osteoarthritis of right hip (M16.11)  Hypercholesterolemia    Allergies No Known Drug Allergies  Family History  Diabetes Mellitus  mother Heart Disease  father Rheumatoid Arthritis  mother  Social History  Tobacco use  former smoker; smoke(d) less than 1/2 pack(s) per day; uses less than half 1/2 can(s) smokeless per week Alcohol use  current drinker; drinks beer and wine; only occasionally per week Pain Contract  no Children  2 Current work status  working full time Drug/Alcohol Rehab (Currently)  no Drug/Alcohol Rehab (Previously)  no Exercise  Exercises weekly; does running / walking Illicit drug use  no Living situation  live with spouse Marital status  married Number of flights of stairs before winded  4-5 Tobacco / smoke exposure  no Post-Surgical Plans  Home With Family, Home with HHPT.  Medication History TraMADol HCl (50MG  Tablet, 1-2 Tablet Oral po qhs as needed for pain, Taken starting 03/10/2017) Active. ([facility] [phone]) Boswellia (Oral) Specific strength unknown - Active. COQ10 Active. Aspirin (81MG  Tablet DR, Oral) Active.  Advil (prn) Active. Tylenol (prn) Active. Turmeric Curcumin (500MG  Capsule, Oral) Active.  Past Surgical History Appendectomy   Review of Systems General Not Present- Chills, Fatigue, Fever,  Memory Loss, Night Sweats, Weight Gain and Weight Loss. Skin Not Present- Eczema, Hives, Itching, Lesions and Rash. HEENT Not Present- Dentures, Double Vision, Headache, Hearing Loss, Tinnitus and Visual Loss. Respiratory Not Present- Allergies, Chronic Cough, Coughing up blood, Shortness of breath at rest and Shortness of breath with exertion. Cardiovascular Not Present- Chest Pain, Difficulty Breathing Lying Down, Murmur, Palpitations, Racing/skipping heartbeats and Swelling. Gastrointestinal Not Present- Abdominal Pain, Bloody Stool, Constipation, Diarrhea, Difficulty Swallowing, Heartburn, Jaundice, Loss of appetitie, Nausea and Vomiting. Male Genitourinary Not Present- Blood in Urine, Discharge, Flank Pain, Incontinence, Painful Urination, Urgency, Urinary frequency, Urinary Retention, Urinating at Night and Weak urinary stream. Musculoskeletal Present- Joint Pain. Not Present- Back Pain, Joint Swelling, Morning Stiffness, Muscle Pain, Muscle Weakness and Spasms. Neurological Not Present- Blackout spells, Difficulty with balance, Dizziness, Paralysis, Tremor and Weakness. Psychiatric Not Present- Insomnia.  Vitals Weight: 232 lb Height: 72in Weight was reported by patient. Height was reported by patient. Body Surface Area: 2.27 m Body Mass Index: 31.46 kg/m  Pulse: 72 (Regular)  BP: 116/72 (Sitting, Right Arm, Standard)   Physical Exam General Mental Status -Alert, cooperative and good historian. General Appearance-pleasant, Not in acute distress. Orientation-Oriented X3. Build & Nutrition-Well nourished and Well developed.  Head and Neck Head-normocephalic, atraumatic . Neck Global Assessment - supple, no bruit auscultated on the right, no bruit auscultated on the left.  Eye Pupil - Bilateral-Regular and Round. Motion - Bilateral-EOMI.  Chest and Lung Exam Auscultation Breath sounds - clear at anterior chest wall and clear at posterior chest wall.  Adventitious sounds - No Adventitious sounds.  Cardiovascular Auscultation Rhythm - Regular rate and rhythm. Heart Sounds - S1 WNL and S2 WNL. Murmurs & Other Heart Sounds - Auscultation of the heart reveals - No Murmurs.  Abdomen Palpation/Percussion Tenderness - Abdomen is non-tender to palpation. Rigidity (guarding) - Abdomen is soft. Auscultation Auscultation of the abdomen reveals - Bowel sounds normal.  Male Genitourinary Note: Not done, not pertinent to present illness   Musculoskeletal Note: Evaluation of the left hip shows flexion to 120 rotation in 30 out 40 and abduction 40 without discomfort. There is no tenderness over the greater trochanter. There is no pain on provocative testing of the hip.Left knee shows no effusion. Range of motion 0-125. No medial or lateral joint line tenderness. No instability. No crepitus on range of motion. Right knee shows no effusion. Range of motion is 0-125. There is no crepitus on range of motion of the knee. There is no medial or lateral joint line tenderness. There is no instability noted. His RIGHT hip can be flexed to about 100 minimal internal rotation, 10 of external rotation 20 of abduction. He does have pain on range of motion of that hip. There is no trochanteric tenderness. Gait pattern is significantly antalgic on the RIGHT.   Assessment & Plan Primary osteoarthritis of right hip (M16.11)  Note:Surgical Plans: Right Total Hip Replacement - Anterior Approach  Disposition: Home with family  PCP: Dr. Dahlia ByesGreen, [facility] - pending  IV TXA  Anesthesia Issues: Nausea and vomiting following previous surgery  Patient was instructed on what medications to stop prior to surgery.  Signed electronically by Lauraine RinneAlexzandrew L Fleetwood Pierron, III PA-C

## 2017-03-27 ENCOUNTER — Other Ambulatory Visit: Payer: Self-pay

## 2017-03-27 ENCOUNTER — Encounter: Payer: Self-pay | Admitting: Family Medicine

## 2017-03-27 ENCOUNTER — Ambulatory Visit: Payer: BC Managed Care – PPO | Admitting: Family Medicine

## 2017-03-27 ENCOUNTER — Ambulatory Visit (INDEPENDENT_AMBULATORY_CARE_PROVIDER_SITE_OTHER): Payer: BC Managed Care – PPO

## 2017-03-27 VITALS — BP 120/62 | HR 115 | Temp 98.3°F | Resp 18 | Ht 72.05 in | Wt 246.4 lb

## 2017-03-27 DIAGNOSIS — N401 Enlarged prostate with lower urinary tract symptoms: Secondary | ICD-10-CM | POA: Diagnosis not present

## 2017-03-27 DIAGNOSIS — J9811 Atelectasis: Secondary | ICD-10-CM

## 2017-03-27 DIAGNOSIS — Z1389 Encounter for screening for other disorder: Secondary | ICD-10-CM

## 2017-03-27 DIAGNOSIS — Z01818 Encounter for other preprocedural examination: Secondary | ICD-10-CM

## 2017-03-27 DIAGNOSIS — Z131 Encounter for screening for diabetes mellitus: Secondary | ICD-10-CM

## 2017-03-27 DIAGNOSIS — Z130 Encounter for screening for diseases of the blood and blood-forming organs and certain disorders involving the immune mechanism: Secondary | ICD-10-CM

## 2017-03-27 DIAGNOSIS — R351 Nocturia: Secondary | ICD-10-CM

## 2017-03-27 LAB — POCT URINALYSIS DIP (MANUAL ENTRY)
BILIRUBIN UA: NEGATIVE
GLUCOSE UA: NEGATIVE mg/dL
Ketones, POC UA: NEGATIVE mg/dL
Leukocytes, UA: NEGATIVE
NITRITE UA: NEGATIVE
Protein Ur, POC: NEGATIVE mg/dL
Spec Grav, UA: 1.025 (ref 1.010–1.025)
UROBILINOGEN UA: 0.2 U/dL
pH, UA: 5.5 (ref 5.0–8.0)

## 2017-03-27 NOTE — Progress Notes (Signed)
Subjective:  By signing my name below, I, [name], attest that this documentation has been prepared under the direction and in the presence of [name], MD Electronically Signed: Charline Bills, ED Scribe 03/27/2017 at 5:20 PM.   Patient ID: Stephen Cannon, male    DOB: 08-21-1955, 61 y.o.   MRN: [id number]  Chief Complaint  Patient presents with  . Pre-op Exam    has paperwork    HPI Stephen Cannon is a 61 y.o. male who presents to Primary Care at [facility] for preoperative evaluation. Seen for CPE 10/13. H/o R hip pain due to arthritis. Planned on R total hip replacement with Dr. [name] on 1/9. Here for pre-operative exam. H/o BPH and allergic rhinitis. No prescription meds currently. On Aspirin once/day. Was referred back to urology to discuss BPH symptoms but no new meds started. Symptoms include urinary frequency and nocturia 2-3 times/night. Denies incomplete emptying or weak stream. Pt has been taking Advil and extra strength Tylenol which makes pain tolerable. Also occasionally takes Tramadol qhs. No pulmonary hx but notes scarring in the lower lungs on previous CXR in 05/2016. No h/o heart disease, cp or sob on exertion, difficulty walking up a heavy hill. RCRI/Goldman risk: 0. Pt has at least 4-6 mets of activity without difficulty. Had a sleep study on 05/20/14, AHI was 5.7. Pt's last surgery was sinus surgery 1.5 yr ago that required anesthesia; did note vomiting upon waking.  Patient Active Problem List   Diagnosis Date Noted  . Allergic rhinitis due to pollen 06/27/2014  . 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 Primary snoring 06/27/2014  . Snoring 05/31/2014  . Low testosterone 01/03/2014  . Allergic conjunctivitis 01/29/2012  . Hypercholesterolemia 08/01/2011   History reviewed. No pertinent past medical history. Past Surgical History:  Procedure Laterality Date  . APPENDECTOMY    . SINUS SURGERY WITH INSTATRAK     No Known Allergies Prior to Admission medications     Medication Sig Start Date End Date Taking? Authorizing Provider  aspirin 81 MG tablet Take 81 mg by mouth daily.   Yes [provider]  co-enzyme Q-10 30 MG capsule Take 30 mg by mouth daily.    Yes [provider]  UNABLE TO FIND Take 1 capsule by mouth. Med Name:Tumeric cap   Yes [provider]  Multiple Vitamin (MULTIVITAMIN) tablet Take 1 tablet by mouth daily.    [provider]   Social History   Socioeconomic History  . Marital status: Married    Spouse name: Not on file  . Number of children: Not on file  . Years of education: Not on file  . Highest education level: Not on file  Social Needs  . Financial resource strain: Not on file  . Food insecurity - worry: Not on file  . Food insecurity - inability: Not on file  . Transportation needs - medical: Not on file  . Transportation needs - non-medical: Not on file  Occupational History  . Not on file  Tobacco Use  . Smoking status: Former Smoker    Last attempt to quit: 04/08/1981    Years since quitting: 35.9  . Smokeless tobacco: Never Used  Substance and Sexual Activity  . Alcohol use: No    Alcohol/week: 0.0 oz  . Drug use: No  . Sexual activity: Yes  Other Topics Concern  . Not on file  Social History Narrative   Caffeine 2 cups daily avg.   Review of Systems  Respiratory: Negative for  shortness of breath.   Cardiovascular: Negative for chest pain.  Genitourinary: Positive for frequency. Negative for difficulty urinating.  Musculoskeletal: Positive for arthralgias.      Objective:   Physical Exam  Constitutional: He is oriented to person, place, and time. He appears well-developed and well-nourished.  HENT:  Head: Normocephalic and atraumatic.  Eyes: EOM are normal. Pupils are equal, round, and reactive to light.  Neck: No JVD present. Carotid bruit is not present.  Cardiovascular: Normal rate, regular rhythm and normal heart sounds.  No murmur heard. Pulmonary/Chest:  Effort normal and breath sounds normal. He has no rales.  Musculoskeletal: He exhibits no edema.  Neurological: He is alert and oriented to person, place, and time.  Skin: Skin is warm and dry.  Psychiatric: He has a normal mood and affect.  Vitals reviewed.  Vitals:   03/27/17 1633  BP: 120/62  Pulse: (!) 115  Resp: 18  Temp: 98.3 F (36.8 C)  TempSrc: Oral  SpO2: 96%  Weight: 246 lb 6.4 oz (111.8 kg)  Height: 6' 0.05" (1.83 m)   EKG reading done by Shade FloodJeffrey R Kareemah Grounds, MD: sinus rhythm, no acute findings Dg Chest 2 View  Result Date: 03/27/2017 CLINICAL DATA:  Preoperative exam, follow-up atelectasis EXAM: CHEST  2 VIEW COMPARISON:  05/23/2016 FINDINGS: Low lung volumes. Minimal scarring at the right base. No acute pulmonary infiltrate or effusion. Normal cardiomediastinal silhouette. No pneumothorax. IMPRESSION: Low lung volumes with minimal scarring at the right base. No radiographic evidence for acute cardiopulmonary abnormality Electronically Signed   By: Jasmine PangKim  Fujinaga M.D.   On: 03/27/2017 17:58      Assessment & Plan:  Stephen LeschesKimball Lucci is a 61 y.o. male Pre-operative clearance - Plan: EKG 12-Lead, PT AND PTT, DG Chest 2 View, CBC with Differential/Platelet, Comprehensive metabolic panel, POCT urinalysis dipstick  Screening for blood disease - Plan: PT AND PTT, CBC with Differential/Platelet  Screening for blood or protein in urine - Plan: POCT urinalysis dipstick  Atelectasis - Plan: DG Chest 2 View  Screening for diabetes mellitus - Plan: Comprehensive metabolic panel  Benign prostatic hyperplasia with nocturia - Plan: POCT urinalysis dipstick  No apparent increased risk of major adverse cardiac event. Caution with foley use with hx of BPH and s/sx of retention discussed. Other preop testing as above.   No orders of the defined types were placed in this encounter.  Patient Instructions   With history of BPH, may be a little more difficult to urinate after catheter,  so watch for any trouble initiating urine after surgery.   I ordered the required tests for surgery/preop except for type and screen as that will need to be done at the hospital.  Depending on their requirements, they may also need bloodwork closer to the time of your surgery.   Good luck and let me know if there are any questions in the meantime.    IF you received an x-ray today, you will receive an invoice from [facility]. Please contact [facility] at [phone] with questions or concerns regarding your invoice.   IF you received labwork today, you will receive an invoice from [facility]. Please contact [facility] at [phone] with questions or concerns regarding your invoice.   Our billing staff will not be able to assist you with questions regarding bills from these companies.  You will be contacted with the lab results as soon as they are available. The fastest way to get your results is to activate your My Chart account. Instructions  are located on the last page of this paperwork. If you have not heard from us regarding the results in 2 weeks, please contact this office.      I personally performed the services described in this documentation, which was scribed in my presence. The recorded information has been reviewed and considered for accuracy and completeness, addended by me as needed, and agree with information above.  Signed,   Meredith StaggersJeffrey Jandiel Magallanes, MD Primary Care at [facility].  03/30/17 11:16 PM

## 2017-03-27 NOTE — Patient Instructions (Addendum)
With history of BPH, may be a little more difficult to urinate after catheter, so watch for any trouble initiating urine after surgery.   I ordered the required tests for surgery/preop except for type and screen as that will need to be done at the hospital.  Depending on their requirements, they may also need bloodwork closer to the time of your surgery.   Good luck and let me know if there are any questions in the meantime.    IF you received an x-ray today, you will receive an invoice from [facility]. Please contact [facility] at [phone] with questions or concerns regarding your invoice.   IF you received labwork today, you will receive an invoice from [facility]. Please contact [facility] at [phone] with questions or concerns regarding your invoice.   Our billing staff will not be able to assist you with questions regarding bills from these companies.  You will be contacted with the lab results as soon as they are available. The fastest way to get your results is to activate your My Chart account. Instructions are located on the last page of this paperwork. If you have not heard from us regarding the results in 2 weeks, please contact this office.

## 2017-03-28 LAB — CBC WITH DIFFERENTIAL/PLATELET
BASOS: 0 %
Basophils Absolute: 0 10*3/uL (ref 0.0–0.2)
EOS (ABSOLUTE): 0.3 10*3/uL (ref 0.0–0.4)
EOS: 4 %
HEMATOCRIT: 45.4 % (ref 37.5–51.0)
HEMOGLOBIN: 15.4 g/dL (ref 13.0–17.7)
IMMATURE GRANULOCYTES: 0 %
Immature Grans (Abs): 0 10*3/uL (ref 0.0–0.1)
Lymphocytes Absolute: 1 10*3/uL (ref 0.7–3.1)
Lymphs: 12 %
MCH: 30.3 pg (ref 26.6–33.0)
MCHC: 33.9 g/dL (ref 31.5–35.7)
MCV: 89 fL (ref 79–97)
MONOS ABS: 0.8 10*3/uL (ref 0.1–0.9)
Monocytes: 9 %
NEUTROS PCT: 75 %
Neutrophils Absolute: 6.4 10*3/uL (ref 1.4–7.0)
Platelets: 218 10*3/uL (ref 150–379)
RBC: 5.08 x10E6/uL (ref 4.14–5.80)
RDW: 13.7 % (ref 12.3–15.4)
WBC: 8.6 10*3/uL (ref 3.4–10.8)

## 2017-03-28 LAB — COMPREHENSIVE METABOLIC PANEL
A/G RATIO: 2 (ref 1.2–2.2)
ALT: 40 IU/L (ref 0–44)
AST: 25 IU/L (ref 0–40)
Albumin: 4.5 g/dL (ref 3.5–5.5)
Alkaline Phosphatase: 66 IU/L (ref 39–117)
BUN / CREAT RATIO: 21 — AB (ref 9–20)
BUN: 20 mg/dL (ref 6–24)
Bilirubin Total: 0.3 mg/dL (ref 0.0–1.2)
CALCIUM: 9.4 mg/dL (ref 8.7–10.2)
CO2: 21 mmol/L (ref 20–29)
Chloride: 102 mmol/L (ref 96–106)
Creatinine, Ser: 0.94 mg/dL (ref 0.76–1.27)
GFR, EST AFRICAN AMERICAN: 102 mL/min/{1.73_m2} (ref >59)
GFR, EST NON AFRICAN AMERICAN: 88 mL/min/{1.73_m2} (ref >59)
Globulin, Total: 2.2 g/dL (ref 1.5–4.5)
Glucose: 100 mg/dL — ABNORMAL HIGH (ref 65–99)
POTASSIUM: 4.1 mmol/L (ref 3.5–5.2)
SODIUM: 139 mmol/L (ref 134–144)
TOTAL PROTEIN: 6.7 g/dL (ref 6.0–8.5)

## 2017-03-28 LAB — PT AND PTT
INR: 1 (ref 0.8–1.2)
Prothrombin Time: 10.3 s (ref 9.1–12.0)
aPTT: 26 s (ref 24–33)

## 2017-03-31 ENCOUNTER — Encounter: Payer: Self-pay | Admitting: Physician Assistant

## 2017-03-31 ENCOUNTER — Other Ambulatory Visit: Payer: Self-pay

## 2017-03-31 ENCOUNTER — Ambulatory Visit: Payer: BC Managed Care – PPO | Admitting: Physician Assistant

## 2017-03-31 VITALS — BP 103/72 | HR 86 | Temp 98.4°F | Resp 16 | Ht 72.5 in | Wt 244.6 lb

## 2017-03-31 DIAGNOSIS — J069 Acute upper respiratory infection, unspecified: Secondary | ICD-10-CM

## 2017-03-31 DIAGNOSIS — R05 Cough: Secondary | ICD-10-CM

## 2017-03-31 DIAGNOSIS — R059 Cough, unspecified: Secondary | ICD-10-CM

## 2017-03-31 MED ORDER — DOXYCYCLINE HYCLATE 100 MG PO CAPS: 100.0000 mg | ORAL_CAPSULE | Freq: Two times a day (BID) | ORAL | 0 refills | Status: AC

## 2017-03-31 NOTE — Patient Instructions (Signed)
     IF you received an x-ray today, you will receive an invoice from [facility]. Please contact [facility] at [phone] with questions or concerns regarding your invoice.   IF you received labwork today, you will receive an invoice from [facility]. Please contact [facility] at [phone] with questions or concerns regarding your invoice.   Our billing staff will not be able to assist you with questions regarding bills from these companies.  You will be contacted with the lab results as soon as they are available. The fastest way to get your results is to activate your My Chart account. Instructions are located on the last page of this paperwork. If you have not heard from us regarding the results in 2 weeks, please contact this office.     

## 2017-03-31 NOTE — Progress Notes (Signed)
04/02/2017 8:37 AM   DOB: 06/13/1955 / MRN: [id number]  SUBJECTIVE:  Stephen Cannon is a 61 y.o. male presenting for cough that started 9 days ago.  The cough is productive.  Tells me he woke up at 2 am this morning due to cough. Assoicates nasal sinus congestion.  The symptoms wax and wane. He does have a history of allergies and also had sinus surgery for a "clean out."  Uses the netti pot and this is helping.  No fever.  No chest pain, SOB, leg swelling, diaphoresis.  He was a smoker from 8577-78.    He has No Known Allergies.   He  has no past medical history on file.    He  reports that he quit smoking about 36 years ago. he has never used smokeless tobacco. He reports that he does not drink alcohol or use drugs. He  reports that he currently engages in sexual activity. The patient  has a past surgical history that includes Appendectomy and Sinus surgery with [employer].  His family history is not on file.  ROS  The problem list and medications were reviewed and updated by myself where necessary and exist elsewhere in the encounter.   OBJECTIVE:  BP 103/72 (BP Location: Left Arm, Patient Position: Sitting, Cuff Size: Large)   Pulse 86   Temp 98.4 F (36.9 C) (Oral)   Resp 16   Ht 6' 0.5" (1.842 m)   Wt 244 lb 9.6 oz (110.9 kg)   SpO2 96%   BMI 32.72 kg/m   Wt Readings from Last 3 Encounters:  03/31/17 244 lb 9.6 oz (110.9 kg)  03/27/17 246 lb 6.4 oz (111.8 kg)  02/11/17 242 lb 9.6 oz (110 kg)   Temp Readings from Last 3 Encounters:  03/31/17 98.4 F (36.9 C) (Oral)  03/27/17 98.3 F (36.8 C) (Oral)  02/11/17 98.3 F (36.8 C) (Oral)   BP Readings from Last 3 Encounters:  03/31/17 103/72  03/27/17 120/62  02/11/17 118/66   Pulse Readings from Last 3 Encounters:  03/31/17 86  03/27/17 (!) 115  02/11/17 84    Physical Exam  Constitutional: He is oriented to person, place, and time. He appears well-developed. He is active and cooperative.  Non-toxic  appearance.  Eyes: EOM are normal. Pupils are equal, round, and reactive to light.  Cardiovascular: Normal rate, regular rhythm, S1 normal, S2 normal, normal heart sounds, intact distal pulses and normal pulses. Exam reveals no gallop and no friction rub.  No murmur heard. Pulmonary/Chest: Effort normal. No stridor. No tachypnea. No respiratory distress. He has no wheezes. He has no rales.  Abdominal: Soft. Normal appearance and bowel sounds are normal. He exhibits no distension and no mass. There is no tenderness. There is no rigidity, no rebound, no guarding and no CVA tenderness. No hernia.  Musculoskeletal: He exhibits no edema.  Neurological: He is alert and oriented to person, place, and time. He has normal strength and normal reflexes. He is not disoriented. No cranial nerve deficit or sensory deficit. He exhibits normal muscle tone. Coordination and gait normal.  Skin: Skin is warm and dry. He is not diaphoretic. No pallor.  Psychiatric: His behavior is normal.  Vitals reviewed.   No results found for this or any previous visit (from the past 72 hour(s)).  No results found.  ASSESSMENT AND PLAN:  Ilean SkillKimball was seen today for cough.  Diagnoses and all orders for this visit:  Protracted URI: He is mostly concerned that he  may not be able to proceed with his upcoming orthopedic surgery if he is not feeling well.  Advised this is unlikely given normal vitals signs and exam.  Advised that his symptoms could be bacterial in nature and to start doxy on day 12 of total illness if he is not improving.   Cough -     doxycycline (VIBRAMYCIN) 100 MG capsule; Take 1 capsule (100 mg total) by mouth 2 (two) times daily for 10 days. Please wait until day 12 of total of illness.    The patient is advised to call or return to clinic if he does not see an improvement in symptoms, or to seek the care of the closest emergency department if he worsens with the above plan.   Deliah BostonMichael Sharunda Salmon, MHS,  PA-C Primary Care at [facility] 04/02/2017 8:37 AM

## 2017-04-09 NOTE — Patient Instructions (Addendum)
Stephen Cannon  04/09/2017   Your procedure is scheduled on: 04-16-17   Report to [facility] Main  Entrance Report to Admitting at 11:15 AM   Call this number if you have problems the morning of surgery [phone]    Do not eat food or drink liquids :After Midnight. You may have a Clear Liquid Diet from Midnight until 7:45 AM. After 7:45 AM, nothing until after surgery.     CLEAR LIQUID DIET   Foods Allowed                                                                     Foods Excluded  Coffee and tea, regular and decaf                             liquids that you cannot  Plain Jell-O in any flavor                                             see through such as: Fruit ices (not with fruit pulp)                                     milk, soups, orange juice  Iced Popsicles                                    All solid food Carbonated beverages, regular and diet                                    Cranberry, grape and apple juices Sports drinks like Gatorade Lightly seasoned clear broth or consume(fat free) Sugar, honey syrup  Sample Menu Breakfast                                Lunch                                     Supper Cranberry juice                    Beef broth                            Chicken broth Jell-O                                     Grape juice                           Apple juice Coffee  or tea                        Jell-O                                      Popsicle                                                Coffee or tea                        Coffee or tea  _____________________________________________________________________    Take these medicines the morning of surgery with A SIP OF WATER: None                                You may not have any metal on your body including hair pins and              piercings  Do not wear jewelry, lotions, powders or deodorant             Men may shave face and neck.   Do not  bring valuables to the hospital. [facility] IS NOT             RESPONSIBLE   FOR VALUABLES.  Contacts, dentures or bridgework may not be worn into surgery.  Leave suitcase in the car. After surgery it may be brought to your room.                 Please read over the following fact sheets you were given: _____________________________________________________________________           [facility] - Preparing for Surgery Before surgery, you can play an important role.  Because skin is not sterile, your skin needs to be as free of germs as possible.  You can reduce the number of germs on your skin by washing with CHG (chlorahexidine gluconate) soap before surgery.  CHG is an antiseptic cleaner which kills germs and bonds with the skin to continue killing germs even after washing. Please DO NOT use if you have an allergy to CHG or antibacterial soaps.  If your skin becomes reddened/irritated stop using the CHG and inform your nurse when you arrive at Short Stay. Do not shave (including legs and underarms) for at least 48 hours prior to the first CHG shower.  You may shave your face/neck. Please follow these instructions carefully:  1.  Shower with CHG Soap the night before surgery and the  morning of Surgery.  2.  If you choose to wash your hair, wash your hair first as usual with your  normal  shampoo.  3.  After you shampoo, rinse your hair and body thoroughly to remove the  shampoo.                           4.  Use CHG as you would any other liquid soap.  You can apply chg directly  to the skin and wash                       Gently  with a scrungie or clean washcloth.  5.  Apply the CHG Soap to your body ONLY FROM THE NECK DOWN.   Do not use on face/ open                           Wound or open sores. Avoid contact with eyes, ears mouth and genitals (private parts).                       Wash face,  Genitals (private parts) with your normal soap.             6.  Wash thoroughly, paying special  attention to the area where your surgery  will be performed.  7.  Thoroughly rinse your body with warm water from the neck down.  8.  DO NOT shower/wash with your normal soap after using and rinsing off  the CHG Soap.                9.  Pat yourself dry with a clean towel.            10.  Wear clean pajamas.            11.  Place clean sheets on your bed the night of your first shower and do not  sleep with pets. Day of Surgery : Do not apply any lotions/deodorants the morning of surgery.  Please wear clean clothes to the hospital/surgery center.  FAILURE TO FOLLOW THESE INSTRUCTIONS MAY RESULT IN THE CANCELLATION OF YOUR SURGERY PATIENT SIGNATURE_________________________________  NURSE SIGNATURE__________________________________  ________________________________________________________________________   Stephen MireIncentive Spirometer  An incentive spirometer is a tool that can help keep your lungs clear and active. This tool measures how well you are filling your lungs with each breath. Taking long deep breaths may help reverse or decrease the chance of developing breathing (pulmonary) problems (especially infection) following:  A long period of time when you are unable to move or be active. BEFORE THE PROCEDURE   If the spirometer includes an indicator to show your best effort, your nurse or respiratory therapist will set it to a desired goal.  If possible, sit up straight or lean slightly forward. Try not to slouch.  Hold the incentive spirometer in an upright position. INSTRUCTIONS FOR USE  1. Sit on the edge of your bed if possible, or sit up as far as you can in bed or on a chair. 2. Hold the incentive spirometer in an upright position. 3. Breathe out normally. 4. Place the mouthpiece in your mouth and seal your lips tightly around it. 5. Breathe in slowly and as deeply as possible, raising the piston or the ball toward the top of the column. 6. Hold your breath for 3-5 seconds or for  as long as possible. Allow the piston or ball to fall to the bottom of the column. 7. Remove the mouthpiece from your mouth and breathe out normally. 8. Rest for a few seconds and repeat Steps 1 through 7 at least 10 times every 1-2 hours when you are awake. Take your time and take a few normal breaths between deep breaths. 9. The spirometer may include an indicator to show your best effort. Use the indicator as a goal to work toward during each repetition. 10. After each set of 10 deep breaths, practice coughing to be sure your lungs are clear. If you have an incision (the cut made at the time of surgery),  support your incision when coughing by placing a pillow or rolled up towels firmly against it. Once you are able to get out of bed, walk around indoors and cough well. You may stop using the incentive spirometer when instructed by your caregiver.  RISKS AND COMPLICATIONS  Take your time so you do not get dizzy or light-headed.  If you are in pain, you may need to take or ask for pain medication before doing incentive spirometry. It is harder to take a deep breath if you are having pain. AFTER USE  Rest and breathe slowly and easily.  It can be helpful to keep track of a log of your progress. Your caregiver can provide you with a simple table to help with this. If you are using the spirometer at home, follow these instructions: SEEK MEDICAL CARE IF:   You are having difficultly using the spirometer.  You have trouble using the spirometer as often as instructed.  Your pain medication is not giving enough relief while using the spirometer.  You develop fever of 100.5 F (38.1 C) or higher. SEEK IMMEDIATE MEDICAL CARE IF:   You cough up bloody sputum that had not been present before.  You develop fever of 102 F (38.9 C) or greater.  You develop worsening pain at or near the incision site. MAKE SURE YOU:   Understand these instructions.  Will watch your condition.  Will get  help right away if you are not doing well or get worse. Document Released: 08/05/2006 Document Revised: 06/17/2011 Document Reviewed: 10/06/2006 Exit[employer] Patient Information 2014 ExitCare, [place].   ________________________________________________________________________  WHAT IS A BLOOD TRANSFUSION? Blood Transfusion Information  A transfusion is the replacement of blood or some of its parts. Blood is made up of multiple cells which provide different functions.  Red blood cells carry oxygen and are used for blood loss replacement.  White blood cells fight against infection.  Platelets control bleeding.  Plasma helps clot blood.  Other blood products are available for specialized needs, such as hemophilia or other clotting disorders. BEFORE THE TRANSFUSION  Who gives blood for transfusions?   Healthy volunteers who are fully evaluated to make sure their blood is safe. This is blood bank blood. Transfusion therapy is the safest it has ever been in the practice of medicine. Before blood is taken from a donor, a complete history is taken to make sure that person has no history of diseases nor engages in risky social behavior (examples are intravenous drug use or sexual activity with multiple partners). The donor's travel history is screened to minimize risk of transmitting infections, such as malaria. The donated blood is tested for signs of infectious diseases, such as HIV and hepatitis. The blood is then tested to be sure it is compatible with you in order to minimize the chance of a transfusion reaction. If you or a relative donates blood, this is often done in anticipation of surgery and is not appropriate for emergency situations. It takes many days to process the donated blood. RISKS AND COMPLICATIONS Although transfusion therapy is very safe and saves many lives, the main dangers of transfusion include:   Getting an infectious disease.  Developing a transfusion reaction. This is an  allergic reaction to something in the blood you were given. Every precaution is taken to prevent this. The decision to have a blood transfusion has been considered carefully by your caregiver before blood is given. Blood is not given unless the benefits outweigh the risks. AFTER THE TRANSFUSION  Right after receiving a blood transfusion, you will usually feel much better and more energetic. This is especially true if your red blood cells have gotten low (anemic). The transfusion raises the level of the red blood cells which carry oxygen, and this usually causes an energy increase.  The nurse administering the transfusion will monitor you carefully for complications. HOME CARE INSTRUCTIONS  No special instructions are needed after a transfusion. You may find your energy is better. Speak with your caregiver about any limitations on activity for underlying diseases you may have. SEEK MEDICAL CARE IF:   Your condition is not improving after your transfusion.  You develop redness or irritation at the intravenous (IV) site. SEEK IMMEDIATE MEDICAL CARE IF:  Any of the following symptoms occur over the next 12 hours:  Shaking chills.  You have a temperature by mouth above 102 F (38.9 C), not controlled by medicine.  Chest, back, or muscle pain.  People around you feel you are not acting correctly or are confused.  Shortness of breath or difficulty breathing.  Dizziness and fainting.  You get a rash or develop hives.  You have a decrease in urine output.  Your urine turns a dark color or changes to pink, red, or brown. Any of the following symptoms occur over the next 10 days:  You have a temperature by mouth above 102 F (38.9 C), not controlled by medicine.  Shortness of breath.  Weakness after normal activity.  The white part of the eye turns yellow (jaundice).  You have a decrease in the amount of urine or are urinating less often.  Your urine turns a dark color or changes  to pink, red, or brown. Document Released: 03/22/2000 Document Revised: 06/17/2011 Document Reviewed: 11/09/2007 [employer] Patient Information 2014 [place], [place].  _______________________________________________________________________

## 2017-04-09 NOTE — Progress Notes (Addendum)
In Epic: 03-27-17 EKG, CXR,  PT/INR,  PTT,  CBC w/Diff,  CMP  04-10-17 Surgical clearance from Dr. Chilton SiGreen on chart

## 2017-04-10 ENCOUNTER — Encounter (HOSPITAL_COMMUNITY)
Admission: RE | Admit: 2017-04-10 | Discharge: 2017-04-10 | Disposition: A | Discharge status: Home / Self Care | Payer: BC Managed Care – PPO | Source: Ambulatory Visit | Attending: Orthopedic Surgery | Admitting: Orthopedic Surgery

## 2017-04-10 ENCOUNTER — Encounter (HOSPITAL_COMMUNITY): Payer: Self-pay

## 2017-04-10 ENCOUNTER — Other Ambulatory Visit: Payer: Self-pay

## 2017-04-10 DIAGNOSIS — Z0183 Encounter for blood typing: Secondary | ICD-10-CM | POA: Diagnosis not present

## 2017-04-10 DIAGNOSIS — Z01812 Encounter for preprocedural laboratory examination: Secondary | ICD-10-CM | POA: Diagnosis present

## 2017-04-10 DIAGNOSIS — M1611 Unilateral primary osteoarthritis, right hip: Secondary | ICD-10-CM | POA: Diagnosis not present

## 2017-04-10 HISTORY — DX: Adverse effect of unspecified anesthetic, initial encounter: T41.45XA

## 2017-04-10 HISTORY — DX: Other specified postprocedural states: Z98.890

## 2017-04-10 HISTORY — DX: Other complications of anesthesia, initial encounter: T88.59XA

## 2017-04-10 HISTORY — DX: Nausea with vomiting, unspecified: R11.2

## 2017-04-10 HISTORY — DX: Unspecified osteoarthritis, unspecified site: M19.90

## 2017-04-10 LAB — COMPREHENSIVE METABOLIC PANEL
ALBUMIN: 4.2 g/dL (ref 3.5–5.0)
ALT: 48 U/L (ref 17–63)
ANION GAP: 5 (ref 5–15)
AST: 31 U/L (ref 15–41)
Alkaline Phosphatase: 55 U/L (ref 38–126)
BUN: 19 mg/dL (ref 6–20)
CHLORIDE: 105 mmol/L (ref 101–111)
CO2: 28 mmol/L (ref 22–32)
Calcium: 9.2 mg/dL (ref 8.9–10.3)
Creatinine, Ser: 0.97 mg/dL (ref 0.61–1.24)
GFR calc Af Amer: >60 mL/min (ref >60)
GFR calc non Af Amer: >60 mL/min (ref >60)
GLUCOSE: 91 mg/dL (ref 65–99)
Potassium: 4.6 mmol/L (ref 3.5–5.1)
SODIUM: 138 mmol/L (ref 135–145)
Total Bilirubin: 0.8 mg/dL (ref 0.3–1.2)
Total Protein: 6.9 g/dL (ref 6.5–8.1)

## 2017-04-10 LAB — CBC
HCT: 45.3 % (ref 39.0–52.0)
HEMOGLOBIN: 15.5 g/dL (ref 13.0–17.0)
MCH: 30.9 pg (ref 26.0–34.0)
MCHC: 34.2 g/dL (ref 30.0–36.0)
MCV: 90.4 fL (ref 78.0–100.0)
Platelets: 187 10*3/uL (ref 150–400)
RBC: 5.01 MIL/uL (ref 4.22–5.81)
RDW: 13.3 % (ref 11.5–15.5)
WBC: 7.1 10*3/uL (ref 4.0–10.5)

## 2017-04-10 LAB — ABO/RH: ABO/RH(D): A POS

## 2017-04-10 LAB — SURGICAL PCR SCREEN
MRSA, PCR: NEGATIVE
STAPHYLOCOCCUS AUREUS: NEGATIVE

## 2017-04-15 NOTE — H&P (Signed)
Stephen Cannon DOB: 03/17/1956 Married / Language: English / Race: White Male Date of Admission:  04/16/2017 CC: Right hip pain History of Present Illness The patient is a 62 year old male who comes in for a preoperative History and Physical. The patient is scheduled for a right total hip arthroplasty (anterior) to be performed by Dr. Frank V. Aluisio, MD at [facility] on 04-16-2017. The patient is a 62 year old male who presented with a hip problem. The patient was seen in referral from Dr. Norris. The patient reports right hip problems including pain symptoms that have been present for many months. The symptoms began without any known injury. Symptoms reported include hip pain (groin), weakness, burning, numbness (right lower ext. from the level of the right knee to the foot) and catching. Current treatment includes nonsteroidal anti-inflammatory drugs (Advil) and non-opioid analgesics (Tylenol). Prior to being seen, the patient was previously evaluated by a colleague. Previous workup for this problem has included hip x-rays. Previous treatment for this problem has included corticosteroid injection (IA injection per Dr. Ramos 12-25-16 with only 1 week of minimal relief). He has had stiffness in his hip for quite a while but the pain did not get real intense until the past 4 months. It started out groin pain intermittently but now is hurting at all times including at rest and at night. It is waking him up at night. Pain is in the groin and radiates to his knee. He has lost more motion in the past several months. It is now interfering with what he can and cannot do. Hip is essentially taking over his life at this time. He is not having any problems in his LEFT hip. He does not have any lower extremity weakness or paresthesia. He is ready to proceed with surgery. They have been treated conservatively in the past for the above stated problem and despite conservative measures, they continue to  have progressive pain and severe functional limitations and dysfunction. They have failed non-operative management including home exercise, medications, and injections. It is felt that they would benefit from undergoing total joint replacement. Risks and benefits of the procedure have been discussed with the patient and they elect to proceed with surgery. There are no active contraindications to surgery such as ongoing infection or rapidly progressive neurological disease.   Problem List/Past Medical Hip pain (M25.559)  Degenerative lumbar disc (M51.36)  Lumbar spinal stenosis (M48.07)  Primary osteoarthritis of right hip (M16.11)  Hypercholesterolemia    Allergies No Known Drug Allergies  Family History  Diabetes Mellitus  mother Heart Disease  father Rheumatoid Arthritis  mother  Social History  Tobacco use  former smoker; smoke(d) less than 1/2 pack(s) per day; uses less than half 1/2 can(s) smokeless per week Alcohol use  current drinker; drinks beer and wine; only occasionally per week Pain Contract  no Children  2 Current work status  working full time Drug/Alcohol Rehab (Currently)  no Drug/Alcohol Rehab (Previously)  no Exercise  Exercises weekly; does running / walking Illicit drug use  no Living situation  live with spouse Marital status  married Number of flights of stairs before winded  4-5 Tobacco / smoke exposure  no Post-Surgical Plans  Home With Family, Home with HHPT.  Medication History TraMADol HCl (50MG Tablet, 1-2 Tablet Oral po qhs as needed for pain, Taken starting 03/10/2017) Active. (called to [facility] [place] [phone]) [place] (Oral) Specific strength unknown - Active. COQ10 Active. Aspirin (81MG Tablet DR, Oral) Active.   Advil (prn) Active. Tylenol (prn) Active. Turmeric Curcumin (500MG  Capsule, Oral) Active.  Past Surgical History Appendectomy   Review of Systems General Not Present- Chills, Fatigue,  Fever, Memory Loss, Night Sweats, Weight Gain and Weight Loss. Skin Not Present- Eczema, Hives, Itching, Lesions and Rash. HEENT Not Present- Dentures, Double Vision, Headache, Hearing Loss, Tinnitus and Visual Loss. Respiratory Not Present- Allergies, Chronic Cough, Coughing up blood, Shortness of breath at rest and Shortness of breath with exertion. Cardiovascular Not Present- Chest Pain, Difficulty Breathing Lying Down, Murmur, Palpitations, Racing/skipping heartbeats and Swelling. Gastrointestinal Not Present- Abdominal Pain, Bloody Stool, Constipation, Diarrhea, Difficulty Swallowing, Heartburn, Jaundice, Loss of appetitie, Nausea and Vomiting. Male Genitourinary Not Present- Blood in Urine, Discharge, Flank Pain, Incontinence, Painful Urination, Urgency, Urinary frequency, Urinary Retention, Urinating at Night and Weak urinary stream. Musculoskeletal Present- Joint Pain. Not Present- Back Pain, Joint Swelling, Morning Stiffness, Muscle Pain, Muscle Weakness and Spasms. Neurological Not Present- Blackout spells, Difficulty with balance, Dizziness, Paralysis, Tremor and Weakness. Psychiatric Not Present- Insomnia.  Vitals Weight: 232 lb Height: 72in Weight was reported by patient. Height was reported by patient. Body Surface Area: 2.27 m Body Mass Index: 31.46 kg/m  Pulse: 72 (Regular)  BP: 116/72 (Sitting, Right Arm, Standard)   Physical Exam General Mental Status -Alert, cooperative and good historian. General Appearance-pleasant, Not in acute distress. Orientation-Oriented X3. Build & Nutrition-Well nourished and Well developed.  Head and Neck Head-normocephalic, atraumatic . Neck Global Assessment - supple, no bruit auscultated on the right, no bruit auscultated on the left.  Eye Pupil - Bilateral-Regular and Round. Motion - Bilateral-EOMI.  Chest and Lung Exam Auscultation Breath sounds - clear at anterior chest wall and clear at posterior  chest wall. Adventitious sounds - No Adventitious sounds.  Cardiovascular Auscultation Rhythm - Regular rate and rhythm. Heart Sounds - S1 WNL and S2 WNL. Murmurs & Other Heart Sounds - Auscultation of the heart reveals - No Murmurs.  Abdomen Palpation/Percussion Tenderness - Abdomen is non-tender to palpation. Rigidity (guarding) - Abdomen is soft. Auscultation Auscultation of the abdomen reveals - Bowel sounds normal.  Male Genitourinary Note: Not done, not pertinent to present illness   Musculoskeletal Note: Evaluation of the left hip shows flexion to 120 rotation in 30 out 40 and abduction 40 without discomfort. There is no tenderness over the greater trochanter. There is no pain on provocative testing of the hip.Left knee shows no effusion. Range of motion 0-125. No medial or lateral joint line tenderness. No instability. No crepitus on range of motion. Right knee shows no effusion. Range of motion is 0-125. There is no crepitus on range of motion of the knee. There is no medial or lateral joint line tenderness. There is no instability noted. His RIGHT hip can be flexed to about 100 minimal internal rotation, 10 of external rotation 20 of abduction. He does have pain on range of motion of that hip. There is no trochanteric tenderness. Gait pattern is significantly antalgic on the RIGHT.   Assessment & Plan Primary osteoarthritis of right hip (M16.11)  Note:Surgical Plans: Right Total Hip Replacement - Anterior Approach  Disposition: Home with family  PCP: Dr. Dahlia ByesGreen, [facility] - pending  IV TXA  Anesthesia Issues: Nausea and vomiting following previous surgery  Patient was instructed on what medications to stop prior to surgery.  Signed electronically by Lauraine RinneAlexzandrew L Perkins, III PA-C

## 2017-04-16 ENCOUNTER — Inpatient Hospital Stay (HOSPITAL_COMMUNITY)
Admission: RE | Admit: 2017-04-16 | Discharge: 2017-04-18 | DRG: 470 | Disposition: A | Discharge status: Home Health | Payer: BC Managed Care – PPO | Source: Ambulatory Visit | Attending: Orthopedic Surgery | Admitting: Orthopedic Surgery

## 2017-04-16 ENCOUNTER — Inpatient Hospital Stay (HOSPITAL_COMMUNITY): Payer: BC Managed Care – PPO | Admitting: Registered Nurse

## 2017-04-16 ENCOUNTER — Inpatient Hospital Stay (HOSPITAL_COMMUNITY): Payer: BC Managed Care – PPO

## 2017-04-16 ENCOUNTER — Encounter (HOSPITAL_COMMUNITY): Payer: Self-pay | Admitting: *Deleted

## 2017-04-16 ENCOUNTER — Other Ambulatory Visit: Payer: Self-pay

## 2017-04-16 ENCOUNTER — Encounter (HOSPITAL_COMMUNITY)
Admission: RE | Disposition: A | Discharge status: Home Health | Payer: Self-pay | Source: Ambulatory Visit | Attending: Orthopedic Surgery

## 2017-04-16 DIAGNOSIS — Z8261 Family history of arthritis: Secondary | ICD-10-CM | POA: Diagnosis not present

## 2017-04-16 DIAGNOSIS — Z7982 Long term (current) use of aspirin: Secondary | ICD-10-CM

## 2017-04-16 DIAGNOSIS — Z79891 Long term (current) use of opiate analgesic: Secondary | ICD-10-CM

## 2017-04-16 DIAGNOSIS — Z87891 Personal history of nicotine dependence: Secondary | ICD-10-CM | POA: Diagnosis not present

## 2017-04-16 DIAGNOSIS — Z79899 Other long term (current) drug therapy: Secondary | ICD-10-CM | POA: Diagnosis not present

## 2017-04-16 DIAGNOSIS — M5136 Other intervertebral disc degeneration, lumbar region: Secondary | ICD-10-CM | POA: Diagnosis present

## 2017-04-16 DIAGNOSIS — E7800 Pure hypercholesterolemia, unspecified: Secondary | ICD-10-CM | POA: Diagnosis present

## 2017-04-16 DIAGNOSIS — Z96649 Presence of unspecified artificial hip joint: Secondary | ICD-10-CM

## 2017-04-16 DIAGNOSIS — Z791 Long term (current) use of non-steroidal anti-inflammatories (NSAID): Secondary | ICD-10-CM | POA: Diagnosis not present

## 2017-04-16 DIAGNOSIS — Z8249 Family history of ischemic heart disease and other diseases of the circulatory system: Secondary | ICD-10-CM | POA: Diagnosis not present

## 2017-04-16 DIAGNOSIS — M48061 Spinal stenosis, lumbar region without neurogenic claudication: Secondary | ICD-10-CM | POA: Diagnosis present

## 2017-04-16 DIAGNOSIS — M25551 Pain in right hip: Secondary | ICD-10-CM

## 2017-04-16 DIAGNOSIS — M1611 Unilateral primary osteoarthritis, right hip: Principal | ICD-10-CM | POA: Diagnosis present

## 2017-04-16 DIAGNOSIS — Z833 Family history of diabetes mellitus: Secondary | ICD-10-CM | POA: Diagnosis not present

## 2017-04-16 DIAGNOSIS — M169 Osteoarthritis of hip, unspecified: Secondary | ICD-10-CM

## 2017-04-16 HISTORY — PX: TOTAL HIP ARTHROPLASTY: SHX124

## 2017-04-16 LAB — APTT: aPTT: 27 seconds (ref 24–36)

## 2017-04-16 LAB — TYPE AND SCREEN
ABO/RH(D): A POS
ANTIBODY SCREEN: NEGATIVE

## 2017-04-16 LAB — PROTIME-INR
INR: 0.98
PROTHROMBIN TIME: 12.9 s (ref 11.4–15.2)

## 2017-04-16 SURGERY — ARTHROPLASTY, HIP, TOTAL, ANTERIOR APPROACH
Anesthesia: General | Site: Hip | Laterality: Right

## 2017-04-16 MED ORDER — TRANEXAMIC ACID 1000 MG/10ML IV SOLN
1000.0000 mg | INTRAVENOUS | Status: AC
  Administered 2017-04-16: 1000 mg via INTRAVENOUS
  Filled 2017-04-16: qty 1100

## 2017-04-16 MED ORDER — PROPOFOL 10 MG/ML IV BOLUS
INTRAVENOUS | Status: AC
Start: 2017-04-16 — End: 2017-04-16
  Filled 2017-04-16: qty 20

## 2017-04-16 MED ORDER — ACETAMINOPHEN 500 MG PO TABS
1000.0000 mg | ORAL_TABLET | Freq: Four times a day (QID) | ORAL | Status: AC
  Administered 2017-04-16 – 2017-04-18 (×4): 1000 mg via ORAL
  Filled 2017-04-16 (×4): qty 2

## 2017-04-16 MED ORDER — 0.9 % SODIUM CHLORIDE (POUR BTL) OPTIME
TOPICAL | Status: DC | PRN
  Administered 2017-04-16: 1000 mL

## 2017-04-16 MED ORDER — TRAMADOL HCL 50 MG PO TABS: 50.0000 mg | ORAL_TABLET | Freq: Four times a day (QID) | ORAL | Status: DC | PRN

## 2017-04-16 MED ORDER — HYDROMORPHONE HCL 1 MG/ML IJ SOLN
1.0000 mg | INTRAMUSCULAR | Status: DC | PRN
  Administered 2017-04-17: 1 mg via INTRAVENOUS

## 2017-04-16 MED ORDER — CEFAZOLIN SODIUM-DEXTROSE 2-4 GM/100ML-% IV SOLN
2.0000 g | Freq: Four times a day (QID) | INTRAVENOUS | Status: AC
  Administered 2017-04-16 – 2017-04-17 (×2): 2 g via INTRAVENOUS
  Filled 2017-04-16 (×2): qty 100

## 2017-04-16 MED ORDER — METHOCARBAMOL 500 MG PO TABS
500.0000 mg | ORAL_TABLET | Freq: Four times a day (QID) | ORAL | Status: DC | PRN
  Administered 2017-04-17 – 2017-04-18 (×3): 500 mg via ORAL
  Filled 2017-04-16 (×3): qty 1

## 2017-04-16 MED ORDER — PROPOFOL 10 MG/ML IV BOLUS
INTRAVENOUS | Status: AC
  Filled 2017-04-16: qty 40

## 2017-04-16 MED ORDER — FENTANYL CITRATE (PF) 100 MCG/2ML IJ SOLN
INTRAMUSCULAR | Status: AC
  Filled 2017-04-16: qty 2

## 2017-04-16 MED ORDER — LACTATED RINGERS IV SOLN
INTRAVENOUS | Status: DC
  Administered 2017-04-16: 12:00:00 via INTRAVENOUS

## 2017-04-16 MED ORDER — SODIUM CHLORIDE 0.9 % IV SOLN
INTRAVENOUS | Status: DC
  Administered 2017-04-16 – 2017-04-17 (×2): via INTRAVENOUS

## 2017-04-16 MED ORDER — MIDAZOLAM HCL 5 MG/5ML IJ SOLN
INTRAMUSCULAR | Status: DC | PRN
  Administered 2017-04-16: 2 mg via INTRAVENOUS

## 2017-04-16 MED ORDER — LIDOCAINE 2% (20 MG/ML) 5 ML SYRINGE
INTRAMUSCULAR | Status: DC | PRN
  Administered 2017-04-16: 50 mg via INTRAVENOUS

## 2017-04-16 MED ORDER — MENTHOL 3 MG MT LOZG: 1.0000 | LOZENGE | OROMUCOSAL | Status: DC | PRN

## 2017-04-16 MED ORDER — MORPHINE SULFATE (PF) 2 MG/ML IV SOLN
1.0000 mg | INTRAVENOUS | Status: DC | PRN
  Administered 2017-04-16: 22:00:00 1 mg via INTRAVENOUS
  Filled 2017-04-16: qty 1

## 2017-04-16 MED ORDER — PHENYLEPHRINE 40 MCG/ML (10ML) SYRINGE FOR IV PUSH (FOR BLOOD PRESSURE SUPPORT)
PREFILLED_SYRINGE | INTRAVENOUS | Status: DC | PRN
  Administered 2017-04-16 (×4): 80 ug via INTRAVENOUS
  Administered 2017-04-16: 40 ug via INTRAVENOUS
  Administered 2017-04-16: 80 ug via INTRAVENOUS

## 2017-04-16 MED ORDER — ONDANSETRON HCL 4 MG PO TABS: 4.0000 mg | ORAL_TABLET | Freq: Four times a day (QID) | ORAL | Status: DC | PRN

## 2017-04-16 MED ORDER — PROPOFOL 500 MG/50ML IV EMUL
INTRAVENOUS | Status: DC | PRN
  Administered 2017-04-16: 40 ug/kg/min via INTRAVENOUS

## 2017-04-16 MED ORDER — HYDROMORPHONE HCL 1 MG/ML IJ SOLN
INTRAMUSCULAR | Status: AC
  Filled 2017-04-16: qty 1

## 2017-04-16 MED ORDER — STERILE WATER FOR IRRIGATION IR SOLN
Status: DC | PRN
  Administered 2017-04-16: 2000 mL

## 2017-04-16 MED ORDER — TRANEXAMIC ACID 1000 MG/10ML IV SOLN
1000.0000 mg | Freq: Once | INTRAVENOUS | Status: AC
  Administered 2017-04-16: 1000 mg via INTRAVENOUS
  Filled 2017-04-16: qty 1100

## 2017-04-16 MED ORDER — METHOCARBAMOL 1000 MG/10ML IJ SOLN
500.0000 mg | Freq: Four times a day (QID) | INTRAVENOUS | Status: DC | PRN
  Administered 2017-04-16 (×2): 500 mg via INTRAVENOUS
  Filled 2017-04-16: qty 550
  Filled 2017-04-16: qty 5

## 2017-04-16 MED ORDER — OXYCODONE HCL 5 MG PO TABS
5.0000 mg | ORAL_TABLET | ORAL | Status: DC | PRN
  Administered 2017-04-17 (×2): 5 mg via ORAL
  Filled 2017-04-16: qty 1

## 2017-04-16 MED ORDER — ONDANSETRON HCL 4 MG/2ML IJ SOLN
INTRAMUSCULAR | Status: DC | PRN
  Administered 2017-04-16: 4 mg via INTRAVENOUS

## 2017-04-16 MED ORDER — CHLORHEXIDINE GLUCONATE 4 % EX LIQD
60.0000 mL | Freq: Once | CUTANEOUS | Status: AC
  Administered 2017-04-16: 4 via TOPICAL

## 2017-04-16 MED ORDER — DIPHENHYDRAMINE HCL 12.5 MG/5ML PO ELIX: 12.5000 mg | ORAL_SOLUTION | ORAL | Status: DC | PRN

## 2017-04-16 MED ORDER — MEPERIDINE HCL 50 MG/ML IJ SOLN: 6.2500 mg | INTRAMUSCULAR | Status: DC | PRN

## 2017-04-16 MED ORDER — PHENYLEPHRINE 40 MCG/ML (10ML) SYRINGE FOR IV PUSH (FOR BLOOD PRESSURE SUPPORT)
PREFILLED_SYRINGE | INTRAVENOUS | Status: AC
  Filled 2017-04-16: qty 10

## 2017-04-16 MED ORDER — BUPIVACAINE IN DEXTROSE 0.75-8.25 % IT SOLN
INTRATHECAL | Status: DC | PRN
  Administered 2017-04-16: 2 mL via INTRATHECAL

## 2017-04-16 MED ORDER — PHENOL 1.4 % MT LIQD: 1.0000 | OROMUCOSAL | Status: DC | PRN

## 2017-04-16 MED ORDER — CEFAZOLIN SODIUM-DEXTROSE 2-4 GM/100ML-% IV SOLN
2.0000 g | INTRAVENOUS | Status: AC
  Administered 2017-04-16: 2 g via INTRAVENOUS
  Filled 2017-04-16: qty 100

## 2017-04-16 MED ORDER — ACETAMINOPHEN 10 MG/ML IV SOLN: 1000.0000 mg | Freq: Once | INTRAVENOUS | Status: DC | PRN

## 2017-04-16 MED ORDER — PROPOFOL 10 MG/ML IV BOLUS
INTRAVENOUS | Status: AC
  Filled 2017-04-16: qty 20

## 2017-04-16 MED ORDER — FLEET ENEMA 7-19 GM/118ML RE ENEM: 1.0000 | ENEMA | Freq: Once | RECTAL | Status: DC | PRN

## 2017-04-16 MED ORDER — DOCUSATE SODIUM 100 MG PO CAPS
100.0000 mg | ORAL_CAPSULE | Freq: Two times a day (BID) | ORAL | Status: DC
  Administered 2017-04-16 – 2017-04-18 (×4): 100 mg via ORAL
  Filled 2017-04-16 (×4): qty 1

## 2017-04-16 MED ORDER — ACETAMINOPHEN 650 MG RE SUPP: 650.0000 mg | RECTAL | Status: DC | PRN

## 2017-04-16 MED ORDER — DEXAMETHASONE SODIUM PHOSPHATE 10 MG/ML IJ SOLN
10.0000 mg | Freq: Once | INTRAMUSCULAR | Status: AC
  Administered 2017-04-17: 10 mg via INTRAVENOUS
  Filled 2017-04-16: qty 1

## 2017-04-16 MED ORDER — LACTATED RINGERS IV SOLN
INTRAVENOUS | Status: DC | PRN
  Administered 2017-04-16: 14:00:00 via INTRAVENOUS

## 2017-04-16 MED ORDER — HYDROMORPHONE HCL 1 MG/ML IJ SOLN
0.2500 mg | INTRAMUSCULAR | Status: DC | PRN
  Administered 2017-04-16: 0.5 mg via INTRAVENOUS

## 2017-04-16 MED ORDER — ACETAMINOPHEN 10 MG/ML IV SOLN
1000.0000 mg | Freq: Once | INTRAVENOUS | Status: AC
Start: 2017-04-16 — End: 2017-04-16
  Administered 2017-04-16: 1000 mg via INTRAVENOUS
  Filled 2017-04-16: qty 100

## 2017-04-16 MED ORDER — FENTANYL CITRATE (PF) 100 MCG/2ML IJ SOLN
INTRAMUSCULAR | Status: DC | PRN
  Administered 2017-04-16: 50 ug via INTRAVENOUS

## 2017-04-16 MED ORDER — BUPIVACAINE-EPINEPHRINE 0.25% -1:200000 IJ SOLN
INTRAMUSCULAR | Status: AC
  Filled 2017-04-16: qty 1

## 2017-04-16 MED ORDER — POLYETHYLENE GLYCOL 3350 17 G PO PACK: 17.0000 g | PACK | Freq: Every day | ORAL | Status: DC | PRN

## 2017-04-16 MED ORDER — DEXAMETHASONE SODIUM PHOSPHATE 10 MG/ML IJ SOLN
10.0000 mg | Freq: Once | INTRAMUSCULAR | Status: AC
  Administered 2017-04-16: 10 mg via INTRAVENOUS

## 2017-04-16 MED ORDER — OXYCODONE HCL 5 MG PO TABS
10.0000 mg | ORAL_TABLET | ORAL | Status: DC | PRN
  Administered 2017-04-16 – 2017-04-18 (×8): 10 mg via ORAL
  Filled 2017-04-16 (×9): qty 2

## 2017-04-16 MED ORDER — PROMETHAZINE HCL 25 MG/ML IJ SOLN
6.2500 mg | INTRAMUSCULAR | Status: DC | PRN
  Administered 2017-04-16: 6.25 mg via INTRAVENOUS

## 2017-04-16 MED ORDER — MIDAZOLAM HCL 2 MG/2ML IJ SOLN
INTRAMUSCULAR | Status: AC
  Filled 2017-04-16: qty 2

## 2017-04-16 MED ORDER — EPHEDRINE SULFATE-NACL 50-0.9 MG/10ML-% IV SOSY
PREFILLED_SYRINGE | INTRAVENOUS | Status: DC | PRN
  Administered 2017-04-16 (×3): 5 mg via INTRAVENOUS

## 2017-04-16 MED ORDER — METOCLOPRAMIDE HCL 5 MG/ML IJ SOLN: 5.0000 mg | Freq: Three times a day (TID) | INTRAMUSCULAR | Status: DC | PRN

## 2017-04-16 MED ORDER — PROMETHAZINE HCL 25 MG/ML IJ SOLN
INTRAMUSCULAR | Status: AC
Start: 2017-04-16 — End: 2017-04-17
  Filled 2017-04-16: qty 1

## 2017-04-16 MED ORDER — LACTATED RINGERS IV SOLN: INTRAVENOUS | Status: DC

## 2017-04-16 MED ORDER — DEXAMETHASONE SODIUM PHOSPHATE 10 MG/ML IJ SOLN
INTRAMUSCULAR | Status: AC
  Filled 2017-04-16: qty 1

## 2017-04-16 MED ORDER — BISACODYL 10 MG RE SUPP: 10.0000 mg | Freq: Every day | RECTAL | Status: DC | PRN

## 2017-04-16 MED ORDER — ONDANSETRON HCL 4 MG/2ML IJ SOLN
INTRAMUSCULAR | Status: AC
  Filled 2017-04-16: qty 2

## 2017-04-16 MED ORDER — HYDROCODONE-ACETAMINOPHEN 7.5-325 MG PO TABS: 1.0000 | ORAL_TABLET | Freq: Once | ORAL | Status: DC | PRN

## 2017-04-16 MED ORDER — BUPIVACAINE HCL (PF) 0.25 % IJ SOLN
INTRAMUSCULAR | Status: AC
  Filled 2017-04-16: qty 30

## 2017-04-16 MED ORDER — ONDANSETRON HCL 4 MG/2ML IJ SOLN
4.0000 mg | Freq: Four times a day (QID) | INTRAMUSCULAR | Status: DC | PRN
  Administered 2017-04-17: 11:00:00 4 mg via INTRAVENOUS
  Filled 2017-04-16: qty 2

## 2017-04-16 MED ORDER — BUPIVACAINE HCL (PF) 0.25 % IJ SOLN
INTRAMUSCULAR | Status: DC | PRN
  Administered 2017-04-16: 30 mL

## 2017-04-16 MED ORDER — RIVAROXABAN 10 MG PO TABS
10.0000 mg | ORAL_TABLET | Freq: Every day | ORAL | Status: DC
  Administered 2017-04-17 – 2017-04-18 (×2): 10 mg via ORAL
  Filled 2017-04-16 (×2): qty 1

## 2017-04-16 MED ORDER — ACETAMINOPHEN 325 MG PO TABS
650.0000 mg | ORAL_TABLET | ORAL | Status: DC | PRN
  Administered 2017-04-18: 12:00:00 650 mg via ORAL
  Filled 2017-04-16: qty 2

## 2017-04-16 MED ORDER — METOCLOPRAMIDE HCL 5 MG PO TABS: 5.0000 mg | ORAL_TABLET | Freq: Three times a day (TID) | ORAL | Status: DC | PRN

## 2017-04-16 MED ORDER — KETOROLAC TROMETHAMINE 15 MG/ML IJ SOLN
15.0000 mg | Freq: Four times a day (QID) | INTRAMUSCULAR | Status: DC | PRN
  Administered 2017-04-17: 01:00:00 15 mg via INTRAVENOUS
  Filled 2017-04-16: qty 1

## 2017-04-16 SURGICAL SUPPLY — 34 items
BAG DECANTER FOR FLEXI CONT (MISCELLANEOUS) ×1 IMPLANT
BAG SPEC THK2 15X12 ZIP CLS (MISCELLANEOUS) ×1
BAG ZIPLOCK 12X15 (MISCELLANEOUS) ×1 IMPLANT
BLADE SAG 18X100X1.27 (BLADE) ×2 IMPLANT
CAPT HIP TOTAL 2 ×1 IMPLANT
CLOTH BEACON ORANGE TIMEOUT ST (SAFETY) ×2 IMPLANT
COVER PERINEAL POST (MISCELLANEOUS) ×2 IMPLANT
COVER SURGICAL LIGHT HANDLE (MISCELLANEOUS) ×2 IMPLANT
DECANTER SPIKE VIAL GLASS SM (MISCELLANEOUS) ×2 IMPLANT
DRAPE STERI IOBAN 125X83 (DRAPES) ×2 IMPLANT
DRAPE U-SHAPE 47X51 STRL (DRAPES) ×4 IMPLANT
DRSG ADAPTIC 3X8 NADH LF (GAUZE/BANDAGES/DRESSINGS) ×2 IMPLANT
DRSG MEPILEX BORDER 4X4 (GAUZE/BANDAGES/DRESSINGS) ×2 IMPLANT
DRSG MEPILEX BORDER 4X8 (GAUZE/BANDAGES/DRESSINGS) ×2 IMPLANT
DURAPREP 26ML APPLICATOR (WOUND CARE) ×2 IMPLANT
ELECT REM PT RETURN 15FT ADLT (MISCELLANEOUS) ×2 IMPLANT
EVACUATOR 1/8 PVC DRAIN (DRAIN) ×2 IMPLANT
GLOVE BIO SURGEON STRL SZ7.5 (GLOVE) ×2 IMPLANT
GLOVE BIO SURGEON STRL SZ8 (GLOVE) ×3 IMPLANT
GLOVE BIOGEL PI IND STRL 8 (GLOVE) ×2 IMPLANT
GLOVE BIOGEL PI INDICATOR 8 (GLOVE) ×2
GOWN STRL REUS W/TWL LRG LVL3 (GOWN DISPOSABLE) ×2 IMPLANT
GOWN STRL REUS W/TWL XL LVL3 (GOWN DISPOSABLE) ×2 IMPLANT
PACK ANTERIOR HIP CUSTOM (KITS) ×2 IMPLANT
STRIP CLOSURE SKIN 1/2X4 (GAUZE/BANDAGES/DRESSINGS) ×3 IMPLANT
SUT ETHIBOND NAB CT1 #1 30IN (SUTURE) ×2 IMPLANT
SUT MNCRL AB 4-0 PS2 18 (SUTURE) ×2 IMPLANT
SUT STRATAFIX 0 PDS 27 VIOLET (SUTURE) ×2
SUT VIC AB 2-0 CT1 27 (SUTURE) ×4
SUT VIC AB 2-0 CT1 TAPERPNT 27 (SUTURE) ×2 IMPLANT
SUTURE STRATFX 0 PDS 27 VIOLET (SUTURE) ×1 IMPLANT
SYR 50ML LL SCALE MARK (SYRINGE) IMPLANT
TRAY FOLEY W/METER SILVER 16FR (SET/KITS/TRAYS/PACK) ×2 IMPLANT
YANKAUER SUCT BULB TIP 10FT TU (MISCELLANEOUS) ×2 IMPLANT

## 2017-04-16 NOTE — Anesthesia Procedure Notes (Signed)
Spinal  Start time: 04/16/2017 12:58 PM End time: 04/16/2017 1:04 PM Staffing Resident/CRNA: Victoriano Lain, CRNA Preanesthetic Checklist Completed: patient identified, site marked, surgical consent, pre-op evaluation, timeout performed, IV checked, risks and benefits discussed and monitors and equipment checked Spinal Block Patient position: sitting Prep: ChloraPrep and site prepped and draped Patient monitoring: heart rate, continuous pulse ox and blood pressure Approach: midline Location: L3-4 Injection technique: single-shot Needle Needle type: Pencan  Needle gauge: 24 G Needle length: 10 cm Assessment Sensory level: T4 Additional Notes Pt placed in sitting position. Spinal kit expiration date checked and verified. One attempt. + CSF, - heme. Pt tolerated well.

## 2017-04-16 NOTE — Anesthesia Preprocedure Evaluation (Addendum)
Anesthesia Evaluation  Patient identified by MRN, date of birth, ID band Patient awake    Reviewed: Allergy & Precautions, NPO status , Patient's Chart, lab work & pertinent test results  History of Anesthesia Complications (+) PONV  Airway Mallampati: II  TM Distance: >3 FB Neck ROM: Full    Dental no notable dental hx.    Pulmonary neg pulmonary ROS, former smoker,    Pulmonary exam normal breath sounds clear to auscultation       Cardiovascular negative cardio ROS Normal cardiovascular exam Rhythm:Regular Rate:Normal     Neuro/Psych negative neurological ROS  negative psych ROS   GI/Hepatic negative GI ROS, Neg liver ROS,   Endo/Other  negative endocrine ROS  Renal/GU negative Renal ROS  negative genitourinary   Musculoskeletal negative musculoskeletal ROS (+) Arthritis ,   Abdominal   Peds negative pediatric ROS (+)  Hematology negative hematology ROS (+)   Anesthesia Other Findings   Reproductive/Obstetrics negative OB ROS                             Lab Results  Component Value Date   WBC 7.1 04/10/2017   HGB 15.5 04/10/2017   HCT 45.3 04/10/2017   MCV 90.4 04/10/2017   PLT 187 04/10/2017   Lab Results  Component Value Date   INR 1.0 03/27/2017    Anesthesia Physical Anesthesia Plan  ASA: II  Anesthesia Plan: Spinal   Post-op Pain Management:    Induction: Intravenous  PONV Risk Score and Plan: 3 and Treatment may vary due to age or medical condition  Airway Management Planned: Mask, Natural Airway and Nasal Cannula  Additional Equipment:   Intra-op Plan:   Post-operative Plan:   Informed Consent: I have reviewed the patients History and Physical, chart, labs and discussed the procedure including the risks, benefits and alternatives for the proposed anesthesia with the patient or authorized representative who has indicated his/her understanding and  acceptance.     Plan Discussed with: CRNA  Anesthesia Plan Comments:        Anesthesia Quick Evaluation

## 2017-04-16 NOTE — Plan of Care (Signed)
df

## 2017-04-16 NOTE — Transfer of Care (Signed)
Immediate Anesthesia Transfer of Care Note  Patient: Stephen Cannon  Procedure(s) Performed: RIGHT TOTAL HIP ARTHROPLASTY ANTERIOR APPROACH (Right Hip)  Patient Location: PACU  Anesthesia Type:MAC and Spinal  Level of Consciousness: awake, alert , oriented and patient cooperative  Airway & Oxygen Therapy: Patient Spontanous Breathing and Patient connected to face mask oxygen  Post-op Assessment: Report given to RN and Post -op Vital signs reviewed and stable  Post vital signs: Reviewed and stable  Last Vitals:  Vitals:   04/16/17 1120 04/16/17 1123  BP:  (!) 146/82  Pulse:  68  Resp: 18   Temp: 36.7 C   SpO2:  96%    Last Pain:  Vitals:   04/16/17 1120  TempSrc: Oral      Patients Stated Pain Goal: 4 (62/03/55 [id number])  Complications: No apparent anesthesia complications

## 2017-04-16 NOTE — Plan of Care (Signed)
Plan of care discussed.   

## 2017-04-16 NOTE — Op Note (Signed)
OPERATIVE REPORT- TOTAL HIP ARTHROPLASTY   PREOPERATIVE DIAGNOSIS: Osteoarthritis of the Right hip.   POSTOPERATIVE DIAGNOSIS: Osteoarthritis of the Right  hip.   PROCEDURE: Right total hip arthroplasty, anterior approach.   SURGEON: Ollen Gross, MD   ASSISTANT: Avel Peace, PA-C  ANESTHESIA:  Spinal  ESTIMATED BLOOD LOSS:-200 mL    DRAINS: Hemovac x1.   COMPLICATIONS: None   CONDITION: PACU - hemodynamically stable.   BRIEF CLINICAL NOTE: Friend Dorfman is a 62 y.o. male who has advanced end-  stage arthritis of their Right  hip with progressively worsening pain and  dysfunction.The patient has failed nonoperative management and presents for  total hip arthroplasty.   PROCEDURE IN DETAIL: After successful administration of spinal  anesthetic, the traction boots for the [facility] bed were placed on both  feet and the patient was placed onto the [facility] bed, boots placed into the leg  holders. The Right hip was then isolated from the perineum with plastic  drapes and prepped and draped in the usual sterile fashion. ASIS and  greater trochanter were marked and a oblique incision was made, starting  at about 1 cm lateral and 2 cm distal to the ASIS and coursing towards  the anterior cortex of the femur. The skin was cut with a 10 blade  through subcutaneous tissue to the level of the fascia overlying the  tensor fascia lata muscle. The fascia was then incised in line with the  incision at the junction of the anterior third and posterior 2/3rd. The  muscle was teased off the fascia and then the interval between the TFL  and the rectus was developed. The Hohmann retractor was then placed at  the top of the femoral neck over the capsule. The vessels overlying the  capsule were cauterized and the fat on top of the capsule was removed.  A Hohmann retractor was then placed anterior underneath the rectus  femoris to give exposure to the entire anterior capsule. A T-shaped   capsulotomy was performed. The edges were tagged and the femoral head  was identified.       Osteophytes are removed off the superior acetabulum.  The femoral neck was then cut in situ with an oscillating saw. Traction  was then applied to the left lower extremity utilizing the [facility]  traction. The femoral head was then removed. Retractors were placed  around the acetabulum and then circumferential removal of the labrum was  performed. Osteophytes were also removed. Reaming starts at 49 mm to  medialize and  Increased in 2 mm increments to 55 mm. We reamed in  approximately 40 degrees of abduction, 20 degrees anteversion. A 56 mm  pinnacle acetabular shell was then impacted in anatomic position under  fluoroscopic guidance with excellent purchase. We did not need to place  any additional dome screws. A 36 mm neutral + 4 marathon liner was then  placed into the acetabular shell.       The femoral lift was then placed along the lateral aspect of the femur  just distal to the vastus ridge. The leg was  externally rotated and capsule  was stripped off the inferior aspect of the femoral neck down to the  level of the lesser trochanter, this was done with electrocautery. The femur was lifted after this was performed. The  leg was then placed in an extended and adducted position essentially delivering the femur. We also removed the capsule superiorly and the piriformis from the piriformis fossa  to gain excellent exposure of the  proximal femur. Rongeur was used to remove some cancellous bone to get  into the lateral portion of the proximal femur for placement of the  initial starter reamer. The starter broaches was placed  the starter broach  and was shown to go down the center of the canal. Broaching  with the  Corail system was then performed starting at size 8, coursing  Up to size 13. A size 13 had excellent torsional and rotational  and axial stability. The trial standard offset neck was then  placed  with a 36 + 5 trial head. The hip was then reduced. We confirmed that  the stem was in the canal both on AP and lateral x-rays. It also has excellent sizing. The hip was reduced with outstanding stability through full extension and full external rotation.. AP pelvis was taken and the leg lengths were measured and found to be equal. Hip was then dislocated again and the femoral head and neck removed. The  femoral broach was removed. Size 13 Corail stem with a standard offset  neck was then impacted into the femur following native anteversion. Has  excellent purchase in the canal. Excellent torsional and rotational and  axial stability. It is confirmed to be in the canal on AP and lateral  fluoroscopic views. The 36 + 5 ceramic head was placed and the hip  reduced with outstanding stability. Again AP pelvis was taken and it  confirmed that the leg lengths were equal. The wound was then copiously  irrigated with saline solution and the capsule reattached and repaired  with Ethibond suture. 30 ml of .25% Bupivicaine was  injected into the capsule and into the edge of the tensor fascia lata as well as subcutaneous tissue. The fascia overlying the tensor fascia lata was then closed with a running #1 V-Loc. Subcu was closed with interrupted 2-0 Vicryl and subcuticular running 4-0 Monocryl. Incision was cleaned  and dried. Steri-Strips and a bulky sterile dressing applied. Hemovac  drain was hooked to suction and then the patient was awakened and transported to  recovery in stable condition.        Please note that a surgical assistant was a medical necessity for this procedure to perform it in a safe and expeditious manner. Assistant was necessary to provide appropriate retraction of vital neurovascular structures and to prevent femoral fracture and allow for anatomic placement of the prosthesis.  Ollen GrossFrank Ivonne Freeburg, M.D.

## 2017-04-16 NOTE — Interval H&P Note (Signed)
History and Physical Interval Note:  04/16/2017 11:30 AM  Stephen Cannon  has presented today for surgery, with the diagnosis of Osteoarthritis Right Hip  The various methods of treatment have been discussed with the patient and family. After consideration of risks, benefits and other options for treatment, the patient has consented to  Procedure(s): RIGHT TOTAL HIP ARTHROPLASTY ANTERIOR APPROACH (Right) as a surgical intervention .  The patient's history has been reviewed, patient examined, no change in status, stable for surgery.  I have reviewed the patient's chart and labs.  Questions were answered to the patient's satisfaction.     [name]

## 2017-04-16 NOTE — Anesthesia Postprocedure Evaluation (Signed)
Anesthesia Post Note  Patient: Ardelle LeschesKimball Maulding  Procedure(s) Performed: RIGHT TOTAL HIP ARTHROPLASTY ANTERIOR APPROACH (Right Hip)     Patient location during evaluation: PACU Anesthesia Type: Spinal Level of consciousness: oriented and awake and alert Pain management: pain level controlled Vital Signs Assessment: post-procedure vital signs reviewed and stable Respiratory status: spontaneous breathing, respiratory function stable and patient connected to nasal cannula oxygen Cardiovascular status: blood pressure returned to baseline and stable Postop Assessment: no headache, no backache and no apparent nausea or vomiting Anesthetic complications: no    Last Vitals:  Vitals:   04/16/17 1545 04/16/17 1600  BP: 128/72 (!) 143/78  Pulse: 64 69  Resp: 13 20  Temp: 36.4 C   SpO2: 100% 100%    Last Pain:  Vitals:   04/16/17 1600  TempSrc:   PainSc: 2                  Trevor IhaStephen A Scot Shiraishi

## 2017-04-17 LAB — BASIC METABOLIC PANEL
ANION GAP: 7 (ref 5–15)
BUN: 14 mg/dL (ref 6–20)
CALCIUM: 8.9 mg/dL (ref 8.9–10.3)
CHLORIDE: 106 mmol/L (ref 101–111)
CO2: 24 mmol/L (ref 22–32)
Creatinine, Ser: 0.85 mg/dL (ref 0.61–1.24)
GFR calc non Af Amer: >60 mL/min (ref >60)
Glucose, Bld: 150 mg/dL — ABNORMAL HIGH (ref 65–99)
Potassium: 4.1 mmol/L (ref 3.5–5.1)
Sodium: 137 mmol/L (ref 135–145)

## 2017-04-17 LAB — CBC
HEMATOCRIT: 41.8 % (ref 39.0–52.0)
HEMOGLOBIN: 14.3 g/dL (ref 13.0–17.0)
MCH: 30.8 pg (ref 26.0–34.0)
MCHC: 34.2 g/dL (ref 30.0–36.0)
MCV: 90.1 fL (ref 78.0–100.0)
Platelets: 216 10*3/uL (ref 150–400)
RBC: 4.64 MIL/uL (ref 4.22–5.81)
RDW: 13.2 % (ref 11.5–15.5)
WBC: 16.1 10*3/uL — AB (ref 4.0–10.5)

## 2017-04-17 MED ORDER — METHOCARBAMOL 500 MG PO TABS: 500.0000 mg | ORAL_TABLET | Freq: Four times a day (QID) | ORAL | 0 refills | Status: DC | PRN

## 2017-04-17 MED ORDER — OXYCODONE HCL 5 MG PO TABS: 5.0000 mg | ORAL_TABLET | ORAL | 0 refills | Status: DC | PRN

## 2017-04-17 MED ORDER — RIVAROXABAN 10 MG PO TABS: 10.0000 mg | ORAL_TABLET | Freq: Every day | ORAL | 0 refills | Status: DC

## 2017-04-17 MED ORDER — TRAMADOL HCL 50 MG PO TABS: 50.0000 mg | ORAL_TABLET | Freq: Four times a day (QID) | ORAL | 0 refills | Status: DC | PRN

## 2017-04-17 NOTE — Progress Notes (Signed)
   Subjective: 1 Day Post-Op Procedure(s) (LRB): RIGHT TOTAL HIP ARTHROPLASTY ANTERIOR APPROACH (Right) Patient reports pain as moderate and severe.  Better today Patient seen in rounds by Dr. Lequita HaltAluisio. Patient is well, but has had some minor complaints of pain in the hip, requiring pain medications We will start therapy today.  If they do well with therapy and meets all goals, then will allow home later this afternoon following therapy. Plan is to go Home after hospital stay.  Objective: Vital signs in last 24 hours: Temp:  [97 F (36.1 C)-98 F (36.7 C)] 97.8 F (36.6 C) (01/10 0536) Pulse Rate:  [62-89] 89 (01/10 0536) Resp:  [11-20] 17 (01/10 0536) BP: (111-146)/(62-90) 120/62 (01/10 0536) SpO2:  [94 %-100 %] 94 % (01/10 0536) Weight:  [110.2 kg (243 lb)] 110.2 kg (243 lb) (01/09 1830)  Intake/Output from previous day:  Intake/Output Summary (Last 24 hours) at 04/17/2017 0820 Last data filed at 04/17/2017 0600 Gross per 24 hour  Intake 3930 ml  Output 2800 ml  Net 1130 ml    Intake/Output this shift: No intake/output data recorded.  Labs: Recent Labs    04/17/17 0542  HGB 14.3   Recent Labs    04/17/17 0542  WBC 16.1*  RBC 4.64  HCT 41.8  PLT 216   Recent Labs    04/17/17 0542  NA 137  K 4.1  CL 106  CO2 24  BUN 14  CREATININE 0.85  GLUCOSE 150*  CALCIUM 8.9   Recent Labs    04/16/17 1149  INR 0.98    EXAM General - Patient is Alert and Appropriate Extremity - Neurovascular intact Sensation intact distally Dorsiflexion/Plantar flexion intact Dressing - dressing C/D/I Motor Function - intact, moving foot and toes well on exam.  Hemovac pulled without difficulty.  Past Medical History:  Diagnosis Date  . Arthritis   . Complication of anesthesia   . PONV (postoperative nausea and vomiting)     Assessment/Plan: 1 Day Post-Op Procedure(s) (LRB): RIGHT TOTAL HIP ARTHROPLASTY ANTERIOR APPROACH (Right) Principal Problem:   OA  (osteoarthritis) of hip  Estimated body mass index is 32.96 kg/m as calculated from the following:   Height as of this encounter: 6' (1.829 m).   Weight as of this encounter: 110.2 kg (243 lb). Up with therapy  DVT Prophylaxis - Xarelto Weight Bearing As Tolerated Right Leg Hemovac Pulled Begin Therapy  If meets goals and able to go home: Up with therapy Diet - Regular diet Follow up - in 2 weeks Activity - WBAT Disposition - Home Condition Upon Discharge - Stable D/C Meds - See DC Summary DVT Prophylaxis - Xarelto  Avel Peacerew Leona Pressly, PA-C Orthopaedic Surgery 04/17/2017, 8:20 AM

## 2017-04-17 NOTE — Progress Notes (Signed)
Physical Therapy Treatment Patient Details Name: Stephen Cannon MRN: [id number] DOB: Jun 25, 1955 Today's Date: 04/17/2017    History of Present Illness Pt s/p R THR     PT Comments    Good progress with mobility.  Pt hopeful for dc home tomorrow.   Follow Up Recommendations  Home health PT;DC plan and follow up therapy as arranged by surgeon     Equipment Recommendations  None recommended by PT    Recommendations for Other Services       Precautions / Restrictions Precautions Precautions: Fall Restrictions Weight Bearing Restrictions: No Other Position/Activity Restrictions: WBAT    Mobility  Bed Mobility Overal bed mobility: Needs Assistance Bed Mobility: Sit to Supine     Supine to sit: Min assist Sit to supine: Min guard   General bed mobility comments: cues for sequence and use of L LE to self assist  Transfers Overall transfer level: Needs assistance Equipment used: Rolling walker (2 wheeled) Transfers: Sit to/from Stand Sit to Stand: Min guard         General transfer comment: cues for LE management and use of UEs to self assist  Ambulation/Gait Ambulation/Gait assistance: Min guard Ambulation Distance (Feet): 170 Feet Assistive device: Rolling walker (2 wheeled) Gait Pattern/deviations: Step-to pattern;Decreased step length - right;Decreased step length - left;Shuffle;Trunk flexed Gait velocity: decr Gait velocity interpretation: Below normal speed for age/gender General Gait Details: cues for posture, position from RW and sequence   Stairs            Wheelchair Mobility    Modified Rankin (Stroke Patients Only)       Balance                                            Cognition Arousal/Alertness: Awake/alert Behavior During Therapy: WFL for tasks assessed/performed Overall Cognitive Status: Within Functional Limits for tasks assessed                                        Exercises Total  Joint Exercises Ankle Circles/Pumps: AAROM;Both;15 reps;Supine Quad Sets: AROM;Both;10 reps;Supine Heel Slides: AAROM;Right;20 reps;Supine Hip ABduction/ADduction: AAROM;15 reps;Right;Supine    General Comments        Pertinent Vitals/Pain Pain Assessment: 0-10 Pain Score: 6  Pain Location: R hip Pain Descriptors / Indicators: Aching;Sore Pain Intervention(s): Limited activity within patient's tolerance;Monitored during session;Ice applied;Patient requesting pain meds-RN notified    Home Living Family/patient expects to be discharged to:: Private residence Living Arrangements: Spouse/significant other Available Help at Discharge: Family Type of Home: House Home Access: Stairs to enter   Home Layout: Two level;Able to live on main level with bedroom/bathroom Home Equipment: Dan Humphreys - 2 wheels;Bedside commode      Prior Function Level of Independence: Independent          PT Goals (current goals can now be found in the care plan section) Acute Rehab PT Goals Patient Stated Goal: Regain IND  PT Goal Formulation: With patient Time For Goal Achievement: 04/19/17 Potential to Achieve Goals: Good Progress towards PT goals: Progressing toward goals    Frequency    7X/week      PT Plan Current plan remains appropriate    Co-evaluation              AM-PAC PT "6 Clicks"  Daily Activity  Outcome Measure  Difficulty turning over in bed (including adjusting bedclothes, sheets and blankets)?: Unable Difficulty moving from lying on back to sitting on the side of the bed? : Unable Difficulty sitting down on and standing up from a chair with arms (e.g., wheelchair, bedside commode, etc,.)?: A Lot Help needed moving to and from a bed to chair (including a wheelchair)?: A Little Help needed walking in hospital room?: A Little Help needed climbing 3-5 steps with a railing? : A Little 6 Click Score: 13    End of Session Equipment Utilized During Treatment: Gait  belt Activity Tolerance: Patient tolerated treatment well Patient left: in chair;with call bell/phone within reach;with family/visitor present Nurse Communication: Mobility status PT Visit Diagnosis: Difficulty in walking, not elsewhere classified (R26.2)     Time: [id number] PT Time Calculation (min) (ACUTE ONLY): 43 min  Charges:  $Gait Training: 8-22 mins $Therapeutic Exercise: 8-22 mins $Therapeutic Activity: 8-22 mins                    G Codes:       Pg [phone]    Stephen Cannon 04/17/2017, 2:59 PM

## 2017-04-17 NOTE — Progress Notes (Signed)
04/17/17  Daine GipBryan Stillwell PA called reg patient's c/o of pain of 10 despite prescribed pain pills. Order received for iv dilaudid and iv tordol. Will continue to monitor patient

## 2017-04-17 NOTE — Discharge Instructions (Signed)
° °Dr. Frank Aluisio °Total Joint Specialist °[facility] °[address] °[place], [place] [address] °[phone] ° °ANTERIOR APPROACH TOTAL HIP REPLACEMENT POSTOPERATIVE DIRECTIONS ° ° °Hip Rehabilitation, Guidelines Following Surgery  °The results of a hip operation are greatly improved after range of motion and muscle strengthening exercises. Follow all safety measures which are given to protect your hip. If any of these exercises cause increased pain or swelling in your joint, decrease the amount until you are comfortable again. Then slowly increase the exercises. Call your caregiver if you have problems or questions.  ° °HOME CARE INSTRUCTIONS  °Remove items at home which could result in a fall. This includes throw rugs or furniture in walking pathways.  °· ICE to the affected hip every three hours for 30 minutes at a time and then as needed for pain and swelling.  Continue to use ice on the hip for pain and swelling from surgery. You may notice swelling that will progress down to the foot and ankle.  This is normal after surgery.  Elevate the leg when you are not up walking on it.   °· Continue to use the breathing machine which will help keep your temperature down.  It is common for your temperature to cycle up and down following surgery, especially at night when you are not up moving around and exerting yourself.  The breathing machine keeps your lungs expanded and your temperature down. ° ° °DIET °You may resume your previous home diet once your are discharged from the hospital. ° °DRESSING / WOUND CARE / SHOWERING °You may shower 3 days after surgery, but keep the wounds dry during showering.  You may use an occlusive plastic wrap (Press'n Seal for example), NO SOAKING/SUBMERGING IN THE BATHTUB.  If the bandage gets wet, change with a clean dry gauze.  If the incision gets wet, pat the wound dry with a clean towel. °You may start showering once you are discharged home but do not  submerge the incision under water. Just pat the incision dry and apply a dry gauze dressing on daily. °Change the surgical dressing daily and reapply a dry dressing each time. ° °ACTIVITY °Walk with your walker as instructed. °Use walker as long as suggested by your caregivers. °Avoid periods of inactivity such as sitting longer than an hour when not asleep. This helps prevent blood clots.  °You may resume a sexual relationship in one month or when given the OK by your doctor.  °You may return to work once you are cleared by your doctor.  °Do not drive a car for 6 weeks or until released by you surgeon.  °Do not drive while taking narcotics. ° °WEIGHT BEARING °Weight bearing as tolerated with assist device (walker, cane, etc) as directed, use it as long as suggested by your surgeon or therapist, typically at least 4-6 weeks. ° °POSTOPERATIVE CONSTIPATION PROTOCOL °Constipation - defined medically as fewer than three stools per week and severe constipation as less than one stool per week. ° °One of the most common issues patients have following surgery is constipation.  Even if you have a regular bowel pattern at home, your normal regimen is likely to be disrupted due to multiple reasons following surgery.  Combination of anesthesia, postoperative narcotics, change in appetite and fluid intake all can affect your bowels.  In order to avoid complications following surgery, here are some recommendations in order to help you during your recovery period. ° °Colace (docusate) - Pick up an over-the-counter   form of Colace or another stool softener and take twice a day as long as you are requiring postoperative pain medications.  Take with a full glass of water daily.  If you experience loose stools or diarrhea, hold the colace until you stool forms back up.  If your symptoms do not get better within 1 week or if they get worse, check with your doctor. ° °Dulcolax (bisacodyl) - Pick up over-the-counter and take as directed  by the product packaging as needed to assist with the movement of your bowels.  Take with a full glass of water.  Use this product as needed if not relieved by Colace only.  ° °MiraLax (polyethylene glycol) - Pick up over-the-counter to have on hand.  MiraLax is a solution that will increase the amount of water in your bowels to assist with bowel movements.  Take as directed and can mix with a glass of water, juice, soda, coffee, or tea.  Take if you go more than two days without a movement. °Do not use MiraLax more than once per day. Call your doctor if you are still constipated or irregular after using this medication for 7 days in a row. ° °If you continue to have problems with postoperative constipation, please contact the office for further assistance and recommendations.  If you experience "the worst abdominal pain ever" or develop nausea or vomiting, please contact the office immediatly for further recommendations for treatment. ° °ITCHING ° If you experience itching with your medications, try taking only a single pain pill, or even half a pain pill at a time.  You can also use Benadryl over the counter for itching or also to help with sleep.  ° °TED HOSE STOCKINGS °Wear the elastic stockings on both legs for three weeks following surgery during the day but you may remove then at night for sleeping. ° °MEDICATIONS °See your medication summary on the “After Visit Summary” that the nursing staff will review with you prior to discharge.  You may have some home medications which will be placed on hold until you complete the course of blood thinner medication.  It is important for you to complete the blood thinner medication as prescribed by your surgeon.  Continue your approved medications as instructed at time of discharge. ° °PRECAUTIONS °If you experience chest pain or shortness of breath - call 911 immediately for transfer to the hospital emergency department.  °If you develop a fever greater that 101 F,  purulent drainage from wound, increased redness or drainage from wound, foul odor from the wound/dressing, or calf pain - CONTACT YOUR SURGEON.   °                                                °FOLLOW-UP APPOINTMENTS °Make sure you keep all of your appointments after your operation with your surgeon and caregivers. You should call the office at the above phone number and make an appointment for approximately two weeks after the date of your surgery or on the date instructed by your surgeon outlined in the "After Visit Summary". ° °RANGE OF MOTION AND STRENGTHENING EXERCISES  °These exercises are designed to help you keep full movement of your hip joint. Follow your caregiver's or physical therapist's instructions. Perform all exercises about fifteen times, three times per day or as directed. Exercise both hips, even if you   have had only one joint replacement. These exercises can be done on a training (exercise) mat, on the floor, on a table or on a bed. Use whatever works the best and is most comfortable for you. Use music or television while you are exercising so that the exercises are a pleasant break in your day. This will make your life better with the exercises acting as a break in routine you can look forward to.  °Lying on your back, slowly slide your foot toward your buttocks, raising your knee up off the floor. Then slowly slide your foot back down until your leg is straight again.  °Lying on your back spread your legs as far apart as you can without causing discomfort.  °Lying on your side, raise your upper leg and foot straight up from the floor as far as is comfortable. Slowly lower the leg and repeat.  °Lying on your back, tighten up the muscle in the front of your thigh (quadriceps muscles). You can do this by keeping your leg straight and trying to raise your heel off the floor. This helps strengthen the largest muscle supporting your knee.  °Lying on your back, tighten up the muscles of your  buttocks both with the legs straight and with the knee bent at a comfortable angle while keeping your heel on the floor.  ° °IF YOU ARE TRANSFERRED TO A SKILLED REHAB FACILITY °If the patient is transferred to a skilled rehab facility following release from the hospital, a list of the current medications will be sent to the facility for the patient to continue.  When discharged from the skilled rehab facility, please have the facility set up the patient's Home Health Physical Therapy prior to being released. Also, the skilled facility will be responsible for providing the patient with their medications at time of release from the facility to include their pain medication, the muscle relaxants, and their blood thinner medication. If the patient is still at the rehab facility at time of the two week follow up appointment, the skilled rehab facility will also need to assist the patient in arranging follow up appointment in our office and any transportation needs. ° °MAKE SURE YOU:  °Understand these instructions.  °Get help right away if you are not doing well or get worse.  ° ° °Pick up stool softner and laxative for home use following surgery while on pain medications. °Do not submerge incision under water. °Please use good hand washing techniques while changing dressing each day. °May shower starting three days after surgery. °Please use a clean towel to pat the incision dry following showers. °Continue to use ice for pain and swelling after surgery. °Do not use any lotions or creams on the incision until instructed by your surgeon. ° °Take Xarelto for two and a half more weeks following discharge from the hospital, then discontinue Xarelto. °Once the patient has completed the Xarelto, they may resume the 81 mg Aspirin. ° ° ° ° ° °Information on my medicine - XARELTO® (Rivaroxaban) ° °Why was Xarelto® prescribed for you? °Xarelto® was prescribed for you to reduce the risk of blood clots forming after orthopedic  surgery. The medical term for these abnormal blood clots is venous thromboembolism (VTE). ° °What do you need to know about xarelto® ? °Take your Xarelto® ONCE DAILY at the same time every day. °You may take it either with or without food. ° °If you have difficulty swallowing the tablet whole, you may crush it and mix in applesauce   just prior to taking your dose. ° °Take Xarelto® exactly as prescribed by your doctor and DO NOT stop taking Xarelto® without talking to the doctor who prescribed the medication.  Stopping without other VTE prevention medication to take the place of Xarelto® may increase your risk of developing a clot. ° °After discharge, you should have regular check-up appointments with your healthcare provider that is prescribing your Xarelto®.   ° °What do you do if you miss a dose? °If you miss a dose, take it as soon as you remember on the same day then continue your regularly scheduled once daily regimen the next day. Do not take two doses of Xarelto® on the same day.  ° °Important Safety Information °A possible side effect of Xarelto® is bleeding. You should call your healthcare provider right away if you experience any of the following: °? Bleeding from an injury or your nose that does not stop. °? Unusual colored urine (red or dark brown) or unusual colored stools (red or black). °? Unusual bruising for unknown reasons. °? A serious fall or if you hit your head (even if there is no bleeding). ° °Some medicines may interact with Xarelto® and might increase your risk of bleeding while on Xarelto®. To help avoid this, consult your healthcare provider or pharmacist prior to using any new prescription or non-prescription medications, including herbals, vitamins, non-steroidal anti-inflammatory drugs (NSAIDs) and supplements. ° °This website has more information on Xarelto®: www.xarelto.com. ° ° ° °

## 2017-04-17 NOTE — Progress Notes (Signed)
Discharge planning, spoke with patient and spouse at bedside. Have chosen [facility], evaluate and treat. Contacted [facility] for referral. Has RW, requesting a cane. Contacted [facility] to deliver to room. [phone]

## 2017-04-17 NOTE — Evaluation (Signed)
Physical Therapy Evaluation Patient Details Name: Stephen Cannon MRN: [id number] DOB: 07/11/1955 Today's Date: 04/17/2017   History of Present Illness  Pt s/p R THR   Clinical Impression  Pt s/p R THR and presents with decreased R LE strength/ROM and post op pain limiting functional mobility.  Pt should progress to dc home with family assist.    Follow Up Recommendations Home health PT;DC plan and follow up therapy as arranged by surgeon    Equipment Recommendations  None recommended by PT    Recommendations for Other Services       Precautions / Restrictions Precautions Precautions: Fall Restrictions Weight Bearing Restrictions: No Other Position/Activity Restrictions: WBAT      Mobility  Bed Mobility Overal bed mobility: Needs Assistance Bed Mobility: Supine to Sit     Supine to sit: Min assist     General bed mobility comments: cues for sequence and use of UEs to self assist  Transfers Overall transfer level: Needs assistance Equipment used: Rolling walker (2 wheeled) Transfers: Sit to/from Stand Sit to Stand: Min assist         General transfer comment: cues for LE management and use of UEs to self assist  Ambulation/Gait Ambulation/Gait assistance: Min assist Ambulation Distance (Feet): 120 Feet Assistive device: Standard walker Gait Pattern/deviations: Step-to pattern;Decreased step length - right;Decreased step length - left;Shuffle;Trunk flexed Gait velocity: decr Gait velocity interpretation: Below normal speed for age/gender General Gait Details: cues for posture, position from RW and sequence  Stairs            Wheelchair Mobility    Modified Rankin (Stroke Patients Only)       Balance                                             Pertinent Vitals/Pain Pain Assessment: 0-10 Pain Score: 5  Pain Location: R hip Pain Descriptors / Indicators: Aching;Sore Pain Intervention(s): Limited activity within patient's  tolerance;Monitored during session;Premedicated before session;Ice applied    Home Living Family/patient expects to be discharged to:: Private residence Living Arrangements: Spouse/significant other Available Help at Discharge: Family Type of Home: House Home Access: Stairs to enter   Secretary/administratorntrance Stairs-Number of Steps: 1 Home Layout: Two level;Able to live on main level with bedroom/bathroom Home Equipment: Dan HumphreysWalker - 2 wheels;Bedside commode      Prior Function Level of Independence: Independent               Hand Dominance        Extremity/Trunk Assessment   Upper Extremity Assessment Upper Extremity Assessment: Overall WFL for tasks assessed    Lower Extremity Assessment Lower Extremity Assessment: RLE deficits/detail RLE Deficits / Details: Strength at hip 2/5 with AAROM at hip to 85 flex and 20 abd    Cervical / Trunk Assessment Cervical / Trunk Assessment: Normal  Communication   Communication: No difficulties  Cognition Arousal/Alertness: Awake/alert Behavior During Therapy: WFL for tasks assessed/performed Overall Cognitive Status: Within Functional Limits for tasks assessed                                        General Comments      Exercises Total Joint Exercises Ankle Circles/Pumps: AAROM;Both;15 reps;Supine Quad Sets: AROM;Both;10 reps;Supine Heel Slides: AAROM;Right;20 reps;Supine Hip ABduction/ADduction: AAROM;15 reps;Right;Supine  Assessment/Plan    PT Assessment Patient needs continued PT services  PT Problem List Decreased strength;Decreased range of motion;Decreased activity tolerance;Decreased mobility;Decreased knowledge of use of DME;Pain       PT Treatment Interventions DME instruction;Gait training;Stair training;Functional mobility training;Therapeutic activities;Therapeutic exercise;Patient/family education    PT Goals (Current goals can be found in the Care Plan section)  Acute Rehab PT Goals Patient Stated  Goal: Regain IND  PT Goal Formulation: With patient Time For Goal Achievement: 04/19/17 Potential to Achieve Goals: Good    Frequency 7X/week   Barriers to discharge        Co-evaluation               AM-PAC PT "6 Clicks" Daily Activity  Outcome Measure Difficulty turning over in bed (including adjusting bedclothes, sheets and blankets)?: Unable Difficulty moving from lying on back to sitting on the side of the bed? : Unable Difficulty sitting down on and standing up from a chair with arms (e.g., wheelchair, bedside commode, etc,.)?: Unable Help needed moving to and from a bed to chair (including a wheelchair)?: A Little Help needed walking in hospital room?: A Little Help needed climbing 3-5 steps with a railing? : A Little 6 Click Score: 12    End of Session Equipment Utilized During Treatment: Gait belt Activity Tolerance: Patient tolerated treatment well Patient left: in chair;with call bell/phone within reach;with family/visitor present Nurse Communication: Mobility status PT Visit Diagnosis: Difficulty in walking, not elsewhere classified (R26.2)    Time: 1610-9604 PT Time Calculation (min) (ACUTE ONLY): 38 min   Charges:   PT Evaluation $PT Eval Low Complexity: 1 Low PT Treatments $Gait Training: 8-22 mins $Therapeutic Exercise: 8-22 mins   PT G Codes:        Pg [phone]   Fredi Hurtado 04/17/2017, 2:54 PM

## 2017-04-17 NOTE — Discharge Summary (Signed)
Physician Discharge Summary   Patient ID: Stephen Cannon MRN: [id number] DOB/AGE: Aug 23, 1955 62 y.o.  Admit date: 04/16/2017 Discharge date: 04/18/2017  Primary Diagnosis: Osteoarthritis of the Right  hip. Admission Diagnoses:  Past Medical History:  Diagnosis Date  . Arthritis   . Complication of anesthesia   . PONV (postoperative nausea and vomiting)    Discharge Diagnoses:   Principal Problem:   OA (osteoarthritis) of hip  Estimated body mass index is 32.96 kg/m as calculated from the following:   Height as of this encounter: 6' (1.829 m).   Weight as of this encounter: 110.2 kg (243 lb).  Procedure(s) (LRB): RIGHT TOTAL HIP ARTHROPLASTY ANTERIOR APPROACH (Right)   Consults: None  HPI: Stephen Cannon is a 62 y.o. male who has advanced end-  stage arthritis of their Right  hip with progressively worsening pain and  dysfunction.The patient has failed nonoperative management and presents for  total hip arthroplasty.   Laboratory Data: Admission on 04/16/2017  Component Date Value Ref Range Status  . aPTT 04/16/2017 27  24 - 36 seconds Final  . Prothrombin Time 04/16/2017 12.9  11.4 - 15.2 seconds Final  . INR 04/16/2017 0.98   Final  . WBC 04/17/2017 16.1* 4.0 - 10.5 K/uL Final  . RBC 04/17/2017 4.64  4.22 - 5.81 MIL/uL Final  . Hemoglobin 04/17/2017 14.3  13.0 - 17.0 g/dL Final  . HCT 04/17/2017 41.8  39.0 - 52.0 % Final  . MCV 04/17/2017 90.1  78.0 - 100.0 fL Final  . MCH 04/17/2017 30.8  26.0 - 34.0 pg Final  . MCHC 04/17/2017 34.2  30.0 - 36.0 g/dL Final  . RDW 04/17/2017 13.2  11.5 - 15.5 % Final  . Platelets 04/17/2017 216  150 - 400 K/uL Final  . Sodium 04/17/2017 137  135 - 145 mmol/L Final  . Potassium 04/17/2017 4.1  3.5 - 5.1 mmol/L Final  . Chloride 04/17/2017 106  101 - 111 mmol/L Final  . CO2 04/17/2017 24  22 - 32 mmol/L Final  . Glucose, Bld 04/17/2017 150* 65 - 99 mg/dL Final  . BUN 04/17/2017 14  6 - 20 mg/dL Final  . Creatinine, Ser 04/17/2017  0.85  0.61 - 1.24 mg/dL Final  . Calcium 04/17/2017 8.9  8.9 - 10.3 mg/dL Final  . GFR calc non Af Amer 04/17/2017 >60  >60 mL/min Final  . GFR calc Af Amer 04/17/2017 >60  >60 mL/min Final   Comment: (NOTE) The eGFR has been calculated using the CKD EPI equation. This calculation has not been validated in all clinical situations. eGFR's persistently <60 mL/min signify possible Chronic Kidney Disease.   Georgiann Hahn gap 04/17/2017 7  5 - 15 Final  Hospital Outpatient Visit on 04/10/2017  Component Date Value Ref Range Status  . ABO/RH(D) 04/10/2017 A POS   Final  . Antibody Screen 04/10/2017 NEG   Final  . Sample Expiration 04/10/2017 04/19/2017   Final  . Extend sample reason 04/10/2017 NO TRANSFUSIONS OR PREGNANCY IN THE PAST 3 MONTHS   Final  . MRSA, PCR 04/10/2017 NEGATIVE  NEGATIVE Final  . Staphylococcus aureus 04/10/2017 NEGATIVE  NEGATIVE Final   Comment: (NOTE) The Xpert SA Assay (FDA approved for NASAL specimens in patients 100 years of age and older), is one component of a comprehensive surveillance program. It is not intended to diagnose infection nor to guide or monitor treatment.   . WBC 04/10/2017 7.1  4.0 - 10.5 K/uL Final  . RBC 04/10/2017 5.01  4.22 -  5.81 MIL/uL Final  . Hemoglobin 04/10/2017 15.5  13.0 - 17.0 g/dL Final  . HCT 04/10/2017 45.3  39.0 - 52.0 % Final  . MCV 04/10/2017 90.4  78.0 - 100.0 fL Final  . MCH 04/10/2017 30.9  26.0 - 34.0 pg Final  . MCHC 04/10/2017 34.2  30.0 - 36.0 g/dL Final  . RDW 04/10/2017 13.3  11.5 - 15.5 % Final  . Platelets 04/10/2017 187  150 - 400 K/uL Final  . Sodium 04/10/2017 138  135 - 145 mmol/L Final  . Potassium 04/10/2017 4.6  3.5 - 5.1 mmol/L Final  . Chloride 04/10/2017 105  101 - 111 mmol/L Final  . CO2 04/10/2017 28  22 - 32 mmol/L Final  . Glucose, Bld 04/10/2017 91  65 - 99 mg/dL Final  . BUN 04/10/2017 19  6 - 20 mg/dL Final  . Creatinine, Ser 04/10/2017 0.97  0.61 - 1.24 mg/dL Final  . Calcium 04/10/2017 9.2  8.9  - 10.3 mg/dL Final  . Total Protein 04/10/2017 6.9  6.5 - 8.1 g/dL Final  . Albumin 04/10/2017 4.2  3.5 - 5.0 g/dL Final  . AST 04/10/2017 31  15 - 41 U/L Final  . ALT 04/10/2017 48  17 - 63 U/L Final  . Alkaline Phosphatase 04/10/2017 55  38 - 126 U/L Final  . Total Bilirubin 04/10/2017 0.8  0.3 - 1.2 mg/dL Final  . GFR calc non Af Amer 04/10/2017 >60  >60 mL/min Final  . GFR calc Af Amer 04/10/2017 >60  >60 mL/min Final   Comment: (NOTE) The eGFR has been calculated using the CKD EPI equation. This calculation has not been validated in all clinical situations. eGFR's persistently <60 mL/min signify possible Chronic Kidney Disease.   . Anion gap 04/10/2017 5  5 - 15 Final  . ABO/RH(D) 04/10/2017 A POS   Final  Office Visit on 03/27/2017  Component Date Value Ref Range Status  . INR 03/27/2017 1.0  0.8 - 1.2 Final   Comment: Reference interval is for non-anticoagulated patients. Suggested INR therapeutic range for Vitamin K antagonist therapy:    Standard Dose (moderate intensity                   therapeutic range):       2.0 - 3.0    Higher intensity therapeutic range       2.5 - 3.5   . Prothrombin Time 03/27/2017 10.3  9.1 - 12.0 sec Final  . aPTT 03/27/2017 26  24 - 33 sec Final   Comment: This test has not been validated for monitoring unfractionated heparin therapy. aPTT-based therapeutic ranges for unfractionated heparin therapy have not been established. For general guidelines on Heparin monitoring, refer to the [employer].   . WBC 03/27/2017 8.6  3.4 - 10.8 x10E3/uL Final  . RBC 03/27/2017 5.08  4.14 - 5.80 x10E6/uL Final  . Hemoglobin 03/27/2017 15.4  13.0 - 17.7 g/dL Final  . Hematocrit 03/27/2017 45.4  37.5 - 51.0 % Final  . MCV 03/27/2017 89  79 - 97 fL Final  . MCH 03/27/2017 30.3  26.6 - 33.0 pg Final  . MCHC 03/27/2017 33.9  31.5 - 35.7 g/dL Final  . RDW 03/27/2017 13.7  12.3 - 15.4 % Final  . Platelets 03/27/2017 218  150 - 379 x10E3/uL  Final  . Neutrophils 03/27/2017 75  Not Estab. % Final  . Lymphs 03/27/2017 12  Not Estab. % Final  . Monocytes 03/27/2017 9  Not Estab. % Final  . Eos 03/27/2017 4  Not Estab. % Final  . Basos 03/27/2017 0  Not Estab. % Final  . Neutrophils Absolute 03/27/2017 6.4  1.4 - 7.0 x10E3/uL Final  . Lymphocytes Absolute 03/27/2017 1.0  0.7 - 3.1 x10E3/uL Final  . Monocytes Absolute 03/27/2017 0.8  0.1 - 0.9 x10E3/uL Final  . EOS (ABSOLUTE) 03/27/2017 0.3  0.0 - 0.4 x10E3/uL Final  . Basophils Absolute 03/27/2017 0.0  0.0 - 0.2 x10E3/uL Final  . Immature Granulocytes 03/27/2017 0  Not Estab. % Final  . Immature Grans (Abs) 03/27/2017 0.0  0.0 - 0.1 x10E3/uL Final  . Glucose 03/27/2017 100* 65 - 99 mg/dL Final  . BUN 03/27/2017 20  6 - 24 mg/dL Final  . Creatinine, Ser 03/27/2017 0.94  0.76 - 1.27 mg/dL Final  . GFR calc non Af Amer 03/27/2017 88  >59 mL/min/1.73 Final  . GFR calc Af Amer 03/27/2017 102  >59 mL/min/1.73 Final  . BUN/Creatinine Ratio 03/27/2017 21* 9 - 20 Final  . Sodium 03/27/2017 139  134 - 144 mmol/L Final  . Potassium 03/27/2017 4.1  3.5 - 5.2 mmol/L Final  . Chloride 03/27/2017 102  96 - 106 mmol/L Final  . CO2 03/27/2017 21  20 - 29 mmol/L Final  . Calcium 03/27/2017 9.4  8.7 - 10.2 mg/dL Final  . Total Protein 03/27/2017 6.7  6.0 - 8.5 g/dL Final  . Albumin 03/27/2017 4.5  3.5 - 5.5 g/dL Final  . Globulin, Total 03/27/2017 2.2  1.5 - 4.5 g/dL Final  . Albumin/Globulin Ratio 03/27/2017 2.0  1.2 - 2.2 Final  . Bilirubin Total 03/27/2017 0.3  0.0 - 1.2 mg/dL Final  . Alkaline Phosphatase 03/27/2017 66  39 - 117 IU/L Final  . AST 03/27/2017 25  0 - 40 IU/L Final  . ALT 03/27/2017 40  0 - 44 IU/L Final  . Color, UA 03/27/2017 yellow  yellow Final  . Clarity, UA 03/27/2017 clear  clear Final  . Glucose, UA 03/27/2017 negative  negative mg/dL Final  . Bilirubin, UA 03/27/2017 negative  negative Final  . Ketones, POC UA 03/27/2017 negative  negative mg/dL Final  . Spec  Grav, UA 03/27/2017 1.025  1.010 - 1.025 Final  . Blood, UA 03/27/2017 moderate* negative Final  . pH, UA 03/27/2017 5.5  5.0 - 8.0 Final  . Protein Ur, POC 03/27/2017 negative  negative mg/dL Final  . Urobilinogen, UA 03/27/2017 0.2  0.2 or 1.0 E.U./dL Final  . Nitrite, UA 03/27/2017 Negative  Negative Final  . Leukocytes, UA 03/27/2017 Negative  Negative Final     X-Rays:Dg Chest 2 View  Result Date: 03/27/2017 CLINICAL DATA:  Preoperative exam, follow-up atelectasis EXAM: CHEST  2 VIEW COMPARISON:  05/23/2016 FINDINGS: Low lung volumes. Minimal scarring at the right base. No acute pulmonary infiltrate or effusion. Normal cardiomediastinal silhouette. No pneumothorax. IMPRESSION: Low lung volumes with minimal scarring at the right base. No radiographic evidence for acute cardiopulmonary abnormality Electronically Signed   By: Donavan Foil M.D.   On: 03/27/2017 17:58   Dg Pelvis Portable  Result Date: 04/16/2017 CLINICAL DATA:  Right total hip replacement EXAM: PORTABLE PELVIS 1-2 VIEWS COMPARISON:  Intraoperative films of earlier the same day. FINDINGS: Portal wall frontal view of the pelvis shows the patient be status post right total hip replacement. No evidence for immediate hardware complications. Surgical drain noted laterally. Gas in the soft tissues is compatible with the immediate postoperative state. IMPRESSION: Status post right total hip  replacement without evidence for immediate hardware complications. Electronically Signed   By: Misty Stanley M.D.   On: 04/16/2017 15:51   Dg C-arm 1-60 Min-no Report  Result Date: 04/16/2017 Fluoroscopy was utilized by the requesting physician.  No radiographic interpretation.    EKG: Orders placed or performed in visit on 03/27/17  . EKG 12-Lead     Hospital Course: Patient was admitted to [facility] and taken to the OR and underwent the above state procedure without complications.  Patient tolerated the procedure well and was  later transferred to the recovery room and then to the orthopaedic floor for postoperative care.  They were given PO and IV analgesics for pain control following their surgery.  They were given 24 hours of postoperative antibiotics of  Anti-infectives (From admission, onward)   Start     Dose/Rate Route Frequency Ordered Stop   04/16/17 1900  ceFAZolin (ANCEF) IVPB 2g/100 mL premix     2 g 200 mL/hr over 30 Minutes Intravenous Every 6 hours 04/16/17 1820 04/17/17 0123   04/16/17 1113  ceFAZolin (ANCEF) IVPB 2g/100 mL premix     2 g 200 mL/hr over 30 Minutes Intravenous On call to O.R. 04/16/17 1113 04/16/17 1335     and started on DVT prophylaxis in the form of Xarelto.   PT and OT were ordered for total hip protocol.  The patient was allowed to be WBAT with therapy. Discharge planning was consulted to help with postop disposition and equipment needs.  Patient had a bad night on the evening of surgery due to pain.  They started to get up OOB with therapy on day one.  Hemovac drain was pulled without difficulty.   Dressing was checked by Dr. Wynelle Link and was OK. Patient was seen in rounds on day 1 and setup for discharge but did not meet all goals and required IV meds.  Keep another day.  Seen on POD 2 by Dr. Wynelle Link and was doing well and setup for home.  Diet - Regular diet Follow up - in 2 weeks Activity - WBAT Disposition - Home Condition Upon Discharge - Stable D/C Meds - See DC Summary DVT Prophylaxis - Xarelto    Discharge Instructions    Call MD / Call 911   Complete by:  As directed    If you experience chest pain or shortness of breath, CALL 911 and be transported to the hospital emergency room.  If you develope a fever above 101 F, pus (white drainage) or increased drainage or redness at the wound, or calf pain, call your surgeon's office.   Change dressing   Complete by:  As directed    You may change your dressing dressing daily with sterile 4 x 4 inch gauze dressing and paper  tape.  Do not submerge the incision under water.   Constipation Prevention   Complete by:  As directed    Drink plenty of fluids.  Prune juice may be helpful.  You may use a stool softener, such as Colace (over the counter) 100 mg twice a day.  Use MiraLax (over the counter) for constipation as needed.   Diet general   Complete by:  As directed    Discharge instructions   Complete by:  As directed    Take Xarelto for two and a half more weeks, then discontinue Xarelto.  Pick up stool softner and laxative for home use following surgery while on pain medications. Do not submerge incision under water. Please use  good hand washing techniques while changing dressing each day. May shower starting three days after surgery. Please use a clean towel to pat the incision dry following showers. Continue to use ice for pain and swelling after surgery. Do not use any lotions or creams on the incision until instructed by your surgeon.  Wear both TED hose on both legs during the day every day for three weeks, but may remove the TED hose at night at home.  Postoperative Constipation Protocol  Constipation - defined medically as fewer than three stools per week and severe constipation as less than one stool per week.  One of the most common issues patients have following surgery is constipation.  Even if you have a regular bowel pattern at home, your normal regimen is likely to be disrupted due to multiple reasons following surgery.  Combination of anesthesia, postoperative narcotics, change in appetite and fluid intake all can affect your bowels.  In order to avoid complications following surgery, here are some recommendations in order to help you during your recovery period.  Colace (docusate) - Pick up an over-the-counter form of Colace or another stool softener and take twice a day as long as you are requiring postoperative pain medications.  Take with a full glass of water daily.  If you experience  loose stools or diarrhea, hold the colace until you stool forms back up.  If your symptoms do not get better within 1 week or if they get worse, check with your doctor.  Dulcolax (bisacodyl) - Pick up over-the-counter and take as directed by the product packaging as needed to assist with the movement of your bowels.  Take with a full glass of water.  Use this product as needed if not relieved by Colace only.   MiraLax (polyethylene glycol) - Pick up over-the-counter to have on hand.  MiraLax is a solution that will increase the amount of water in your bowels to assist with bowel movements.  Take as directed and can mix with a glass of water, juice, soda, coffee, or tea.  Take if you go more than two days without a movement. Do not use MiraLax more than once per day. Call your doctor if you are still constipated or irregular after using this medication for 7 days in a row.  If you continue to have problems with postoperative constipation, please contact the office for further assistance and recommendations.  If you experience "the worst abdominal pain ever" or develop nausea or vomiting, please contact the office immediatly for further recommendations for treatment.   Do not sit on low chairs, stoools or toilet seats, as it may be difficult to get up from low surfaces   Complete by:  As directed    Driving restrictions   Complete by:  As directed    No driving until released by the physician.   Increase activity slowly as tolerated   Complete by:  As directed    Lifting restrictions   Complete by:  As directed    No lifting until released by the physician.   Patient may shower   Complete by:  As directed    You may shower without a dressing once there is no drainage.  Do not wash over the wound.  If drainage remains, do not shower until drainage stops.   TED hose   Complete by:  As directed    Use stockings (TED hose) for 3 weeks on both leg(s).  You may remove them at night for sleeping.  Weight bearing as tolerated   Complete by:  As directed      Allergies as of 04/17/2017   No Known Allergies     Medication List    STOP taking these medications   aspirin 81 MG tablet   ibuprofen 200 MG tablet Commonly known as:  ADVIL,MOTRIN     TAKE these medications   acetaminophen 500 MG tablet Commonly known as:  TYLENOL Take 1,000 mg by mouth daily with lunch.   methocarbamol 500 MG tablet Commonly known as:  ROBAXIN Take 1 tablet (500 mg total) by mouth every 6 (six) hours as needed for muscle spasms.   oxyCODONE 5 MG immediate release tablet Commonly known as:  Oxy IR/ROXICODONE Take 1-2 tablets (5-10 mg total) by mouth every 4 (four) hours as needed for moderate pain or severe pain.   rivaroxaban 10 MG Tabs tablet Commonly known as:  XARELTO Take 1 tablet (10 mg total) by mouth daily with breakfast. Take Xarelto for two and a half more weeks following discharge from the hospital, then discontinue Xarelto. Once the patient has completed the Xarelto, they may resume the 81 mg Aspirin. Start taking on:  04/18/2017   traMADol 50 MG tablet Commonly known as:  ULTRAM Take 1 tablet (50 mg total) by mouth every 6 (six) hours as needed (mild pain). What changed:    when to take this  reasons to take this            Discharge Care Instructions  (From admission, onward)        Start     Ordered   04/17/17 0000  Weight bearing as tolerated     04/17/17 0831   04/17/17 0000  Change dressing    Comments:  You may change your dressing dressing daily with sterile 4 x 4 inch gauze dressing and paper tape.  Do not submerge the incision under water.   04/17/17 0831     Follow-up Information    Gaynelle Arabian, MD. Schedule an appointment as soon as possible for a visit on 04/29/2017.   Specialty:  Orthopedic Surgery Contact information: [address] [place] [address] [phone]           Signed: Arlee Muslim, PA-C Orthopaedic  Surgery 04/17/2017, 8:32 AM

## 2017-04-18 ENCOUNTER — Encounter (HOSPITAL_COMMUNITY): Payer: Self-pay | Admitting: Orthopedic Surgery

## 2017-04-18 LAB — BASIC METABOLIC PANEL
Anion gap: 4 — ABNORMAL LOW (ref 5–15)
BUN: 14 mg/dL (ref 6–20)
CHLORIDE: 106 mmol/L (ref 101–111)
CO2: 27 mmol/L (ref 22–32)
Calcium: 8.5 mg/dL — ABNORMAL LOW (ref 8.9–10.3)
Creatinine, Ser: 0.74 mg/dL (ref 0.61–1.24)
GFR calc non Af Amer: >60 mL/min (ref >60)
Glucose, Bld: 124 mg/dL — ABNORMAL HIGH (ref 65–99)
POTASSIUM: 3.8 mmol/L (ref 3.5–5.1)
SODIUM: 137 mmol/L (ref 135–145)

## 2017-04-18 LAB — CBC
HCT: 38.5 % — ABNORMAL LOW (ref 39.0–52.0)
HEMOGLOBIN: 12.9 g/dL — AB (ref 13.0–17.0)
MCH: 30.5 pg (ref 26.0–34.0)
MCHC: 33.5 g/dL (ref 30.0–36.0)
MCV: 91 fL (ref 78.0–100.0)
Platelets: 186 10*3/uL (ref 150–400)
RBC: 4.23 MIL/uL (ref 4.22–5.81)
RDW: 13.5 % (ref 11.5–15.5)
WBC: 11.8 10*3/uL — ABNORMAL HIGH (ref 4.0–10.5)

## 2017-04-18 NOTE — Progress Notes (Signed)
   Subjective: 2 Days Post-Op Procedure(s) (LRB): RIGHT TOTAL HIP ARTHROPLASTY ANTERIOR APPROACH (Right) Patient reports pain as mild and moderate.   Patient seen in rounds with Dr. Lequita HaltAluisio.  Severe pain last night but better this morning.  Patient is well, but has had some minor complaints of pain in the hip and thigh, requiring pain medications Patient is ready to go home following therapy goals.  Objective: Vital signs in last 24 hours: Temp:  [98.1 F (36.7 C)-98.6 F (37 C)] 98.6 F (37 C) (01/11 0605) Pulse Rate:  [66-78] 66 (01/11 0605) Resp:  [16-18] 18 (01/11 0605) BP: (114-140)/(52-78) 114/52 (01/11 0605) SpO2:  [94 %-98 %] 94 % (01/11 0605)  Intake/Output from previous day:  Intake/Output Summary (Last 24 hours) at 04/18/2017 0729 Last data filed at 04/18/2017 0606 Gross per 24 hour  Intake 1082 ml  Output 2575 ml  Net -1493 ml    Intake/Output this shift: No intake/output data recorded.  Labs: Recent Labs    04/17/17 0542 04/18/17 0515  HGB 14.3 12.9*   Recent Labs    04/17/17 0542 04/18/17 0515  WBC 16.1* 11.8*  RBC 4.64 4.23  HCT 41.8 38.5*  PLT 216 186   Recent Labs    04/17/17 0542 04/18/17 0515  NA 137 137  K 4.1 3.8  CL 106 106  CO2 24 27  BUN 14 14  CREATININE 0.85 0.74  GLUCOSE 150* 124*  CALCIUM 8.9 8.5*   Recent Labs    04/16/17 1149  INR 0.98    EXAM: General - Patient is Alert, Appropriate and Oriented Extremity - Neurovascular intact Sensation intact distally Intact pulses distally Dorsiflexion/Plantar flexion intact Incision - clean, dry, no drainage Motor Function - intact, moving foot and toes well on exam.   Assessment/Plan: 2 Days Post-Op Procedure(s) (LRB): RIGHT TOTAL HIP ARTHROPLASTY ANTERIOR APPROACH (Right) Procedure(s) (LRB): RIGHT TOTAL HIP ARTHROPLASTY ANTERIOR APPROACH (Right) Past Medical History:  Diagnosis Date  . Arthritis   . Complication of anesthesia   . PONV (postoperative nausea and  vomiting)    Principal Problem:   OA (osteoarthritis) of hip  Estimated body mass index is 32.96 kg/m as calculated from the following:   Height as of this encounter: 6' (1.829 m).   Weight as of this encounter: 110.2 kg (243 lb). Up with therapy Discharge home with home health Diet - Regular diet Follow up - in 2 weeks Activity - WBAT Disposition - Home Condition Upon Discharge - Stable D/C Meds - See DC Summary DVT Prophylaxis - Xarelto  Avel Peacerew Aadan Chenier, PA-C Orthopaedic Surgery 04/18/2017, 7:29 AM

## 2017-04-18 NOTE — Progress Notes (Signed)
Physical Therapy Treatment Patient Details Name: Stephen Cannon MRN: [id number] DOB: 1955/09/17 Today's Date: 04/18/2017    History of Present Illness Pt s/p R THR     PT Comments    Pt progressing steadily with mobility and eager for dc home.  Spouse present to review home therex, stairs and car transfers.   Follow Up Recommendations  Home health PT;DC plan and follow up therapy as arranged by surgeon     Equipment Recommendations  None recommended by PT    Recommendations for Other Services       Precautions / Restrictions Precautions Precautions: Fall Restrictions Weight Bearing Restrictions: No Other Position/Activity Restrictions: WBAT    Mobility  Bed Mobility Overal bed mobility: Needs Assistance Bed Mobility: Supine to Sit     Supine to sit: Min guard     General bed mobility comments: cues for sequence and use of L LE to self assist  Transfers Overall transfer level: Needs assistance Equipment used: Rolling walker (2 wheeled) Transfers: Sit to/from Stand Sit to Stand: Min guard;Supervision         General transfer comment: cues for LE management and use of UEs to self assist  Ambulation/Gait Ambulation/Gait assistance: Min guard;Supervision Ambulation Distance (Feet): 120 Feet Assistive device: Rolling walker (2 wheeled) Gait Pattern/deviations: Step-to pattern;Decreased step length - right;Decreased step length - left;Shuffle;Trunk flexed Gait velocity: decr Gait velocity interpretation: Below normal speed for age/gender General Gait Details: cues for posture, position from RW and sequence   Stairs Stairs: Yes   Stair Management: No rails;Step to pattern;Forwards;With walker Number of Stairs: 2 General stair comments: single step twice with cues for sequence and foot/RW placement  Wheelchair Mobility    Modified Rankin (Stroke Patients Only)       Balance Overall balance assessment: No apparent balance deficits (not formally  assessed)                                          Cognition Arousal/Alertness: Awake/alert Behavior During Therapy: WFL for tasks assessed/performed Overall Cognitive Status: Within Functional Limits for tasks assessed                                        Exercises Total Joint Exercises Ankle Circles/Pumps: AAROM;Both;15 reps;Supine Quad Sets: AROM;Both;10 reps;Supine Heel Slides: AAROM;Right;20 reps;Supine Hip ABduction/ADduction: AAROM;15 reps;Right;Supine Long Arc Quad: AROM;Right;10 reps;Seated    General Comments        Pertinent Vitals/Pain Pain Assessment: 0-10 Pain Score: 6  Pain Location: R hip Pain Descriptors / Indicators: Aching;Sore Pain Intervention(s): Limited activity within patient's tolerance;Monitored during session;Premedicated before session;Ice applied    Home Living                      Prior Function            PT Goals (current goals can now be found in the care plan section) Acute Rehab PT Goals Patient Stated Goal: Regain IND  PT Goal Formulation: With patient Time For Goal Achievement: 04/19/17 Potential to Achieve Goals: Good Progress towards PT goals: Progressing toward goals    Frequency    7X/week      PT Plan Current plan remains appropriate    Co-evaluation  AM-PAC PT "6 Clicks" Daily Activity  Outcome Measure  Difficulty turning over in bed (including adjusting bedclothes, sheets and blankets)?: A Lot Difficulty moving from lying on back to sitting on the side of the bed? : A Lot Difficulty sitting down on and standing up from a chair with arms (e.g., wheelchair, bedside commode, etc,.)?: A Little Help needed moving to and from a bed to chair (including a wheelchair)?: A Little Help needed walking in hospital room?: A Little Help needed climbing 3-5 steps with a railing? : A Little 6 Click Score: 16    End of Session Equipment Utilized During Treatment:  Gait belt Activity Tolerance: Patient tolerated treatment well Patient left: in chair;with call bell/phone within reach;with family/visitor present Nurse Communication: Mobility status PT Visit Diagnosis: Difficulty in walking, not elsewhere classified (R26.2)     Time: [id number] PT Time Calculation (min) (ACUTE ONLY): 47 min  Charges:  $Gait Training: 8-22 mins $Therapeutic Exercise: 8-22 mins $Therapeutic Activity: 8-22 mins                    G Codes:       Pg [phone]    Alvester Eads 04/18/2017, 11:59 AM

## 2017-07-25 ENCOUNTER — Encounter: Payer: Self-pay | Admitting: Family Medicine

## 2017-07-25 ENCOUNTER — Other Ambulatory Visit: Payer: Self-pay

## 2017-07-25 ENCOUNTER — Ambulatory Visit (INDEPENDENT_AMBULATORY_CARE_PROVIDER_SITE_OTHER): Payer: Self-pay | Admitting: Family Medicine

## 2017-07-25 VITALS — BP 118/72 | HR 80 | Temp 98.0°F | Resp 16 | Ht 72.05 in | Wt 242.0 lb

## 2017-07-25 DIAGNOSIS — Z024 Encounter for examination for driving license: Secondary | ICD-10-CM

## 2017-07-25 NOTE — Progress Notes (Signed)
Subjective:  By signing my name below, I, [name], attest that this documentation has been prepared under the direction and in the presence of Shade FloodJeffrey R Lamont Glasscock, MD Electronically Signed: Charline BillsEssence Howell, ED Scribe 07/25/2017 at 12:23 PM.   Patient ID: Stephen Cannon, male    DOB: 11/27/1955, 62 y.o.   MRN: [id number]  Chief Complaint  Patient presents with  . DOT Physical   HPI Stephen Cannon is a 62 y.o. male who [facility] at [facility] for a DOT physical. H/o hyperlipidemia, low testosterone, snoring. Sleep study in 2016 at [facility]. Mild to moderate snoring however his AHI was low at 5.7. He is not on CPAP. H/o allergic rhinitis. H/o hematuria evaluated by urology and BPH. Most recent visit 04/09/17. - Pt denies visible hematuria. Also denies daytime somnolence, snoring at night. Occasional L arm pain without weakness that does not affect his inability to drive.  Patient Active Problem List   Diagnosis Date Noted  . OA (osteoarthritis) of hip 04/16/2017  . Allergic rhinitis due to pollen 06/27/2014  . 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 Primary snoring 06/27/2014  . Snoring 05/31/2014  . Low testosterone 01/03/2014  . Allergic conjunctivitis 01/29/2012  . Hypercholesterolemia 08/01/2011   Past Medical History:  Diagnosis Date  . Arthritis   . Complication of anesthesia   . PONV (postoperative nausea and vomiting)    Past Surgical History:  Procedure Laterality Date  . APPENDECTOMY    . SINUS SURGERY WITH INSTATRAK    . TOTAL HIP ARTHROPLASTY Right 04/16/2017   Procedure: RIGHT TOTAL HIP ARTHROPLASTY ANTERIOR APPROACH;  Surgeon: Ollen GrossAluisio, Frank, MD;  Location: [facility];  Service: Orthopedics;  Laterality: Right;   No Known Allergies Prior to Admission medications   Medication Sig Start Date End Date Taking? Authorizing Provider  acetaminophen (TYLENOL) 500 MG tablet Take 1,000 mg by mouth daily with lunch.    [provider]  methocarbamol (ROBAXIN) 500 MG  tablet Take 1 tablet (500 mg total) by mouth every 6 (six) hours as needed for muscle spasms. 04/17/17   Perkins, Alexzandrew L, PA-C  oxyCODONE (OXY IR/ROXICODONE) 5 MG immediate release tablet Take 1-2 tablets (5-10 mg total) by mouth every 4 (four) hours as needed for moderate pain or severe pain. 04/17/17   Perkins, Alexzandrew L, PA-C  rivaroxaban (XARELTO) 10 MG TABS tablet Take 1 tablet (10 mg total) by mouth daily with breakfast. Take Xarelto for two and a half more weeks following discharge from the hospital, then discontinue Xarelto. Once the patient has completed the Xarelto, they may resume the 81 mg Aspirin. 04/18/17   Perkins, Alexzandrew L, PA-C  traMADol (ULTRAM) 50 MG tablet Take 1 tablet (50 mg total) by mouth every 6 (six) hours as needed (mild pain). 04/17/17   Perkins, Alexzandrew L, PA-C   Social History   Socioeconomic History  . Marital status: Married    Spouse name: Not on file  . Number of children: Not on file  . Years of education: Not on file  . Highest education level: Not on file  Occupational History  . Not on file  Social Needs  . Financial resource strain: Not on file  . Food insecurity:    Worry: Not on file    Inability: Not on file  . Transportation needs:    Medical: Not on file    Non-medical: Not on file  Tobacco Use  . Smoking status: Former Smoker    Last attempt to quit: 04/08/1981  Years since quitting: 36.3  . Smokeless tobacco: Never Used  Substance and Sexual Activity  . Alcohol use: No    Alcohol/week: 0.0 oz  . Drug use: No  . Sexual activity: Yes  Lifestyle  . Physical activity:    Days per week: Not on file    Minutes per session: Not on file  . Stress: Not on file  Relationships  . Social connections:    Talks on phone: Not on file    Gets together: Not on file    Attends religious service: Not on file    Active member of club or organization: Not on file    Attends meetings of clubs or organizations: Not on file     Relationship status: Not on file  . Intimate partner violence:    Fear of current or ex partner: Not on file    Emotionally abused: Not on file    Physically abused: Not on file    Forced sexual activity: Not on file  Other Topics Concern  . Not on file  Social History Narrative   Caffeine 2 cups daily avg.   Review of Systems  Genitourinary: Negative for hematuria.  Musculoskeletal: Positive for arthralgias (occasional L arm pain).  Neurological: Negative for weakness.  Psychiatric/Behavioral: Negative for sleep disturbance.      Objective:   Physical Exam  Constitutional: He is oriented to person, place, and time. He appears well-developed and well-nourished.  HENT:  Head: Normocephalic and atraumatic.  Right Ear: External ear normal.  Left Ear: External ear normal.  Mouth/Throat: Oropharynx is clear and moist.  Eyes: Pupils are equal, round, and reactive to light. Conjunctivae and EOM are normal.  Neck: Normal range of motion. Neck supple. No thyromegaly present.  Cardiovascular: Normal rate, regular rhythm, normal heart sounds and intact distal pulses.  Pulmonary/Chest: Effort normal and breath sounds normal. No respiratory distress. He has no wheezes.  Abdominal: Soft. He exhibits no distension. There is no tenderness. Hernia confirmed negative in the right inguinal area and confirmed negative in the left inguinal area.  Musculoskeletal: Normal range of motion. He exhibits no edema or tenderness.  Lymphadenopathy:    He has no cervical adenopathy.  Neurological: He is alert and oriented to person, place, and time. He has normal reflexes.  Skin: Skin is warm and dry.  Psychiatric: He has a normal mood and affect. His behavior is normal.  Vitals reviewed.    Vitals:   07/25/17 1149  BP: 118/72  Pulse: 80  Resp: 16  Temp: 98 F (36.7 C)  SpO2: 98%  Weight: 242 lb (109.8 kg)  Height: 6' 0.05" (1.83 m)    Visual Acuity Screening   Right eye Left eye Both eyes    Without correction: 20/20 20/20 20/20   With correction:         Assessment & Plan:  Stephen Cannon is a 62 y.o. male Encounter for commercial driver medical examination (CDME)  -No specific concerns or restrictions identified on exam or history.  Prior sleep study testing was reassuring, and symptoms have improved since sinus surgery.  Hematuria noted on urinalysis, has been evaluated by urology, asymptomatic.  2-year card provided, see paperwork.  No orders of the defined types were placed in this encounter.  Patient Instructions   2 year card for DOT. No restrictions.     IF you received an x-ray today, you will receive an invoice from [facility]. Please contact [facility] at [phone] with questions or concerns regarding  your invoice.   IF you received labwork today, you will receive an invoice from [facility]. Please contact [facility] at [phone] with questions or concerns regarding your invoice.   Our billing staff will not be able to assist you with questions regarding bills from these companies.  You will be contacted with the lab results as soon as they are available. The fastest way to get your results is to activate your My Chart account. Instructions are located on the last page of this paperwork. If you have not heard from [place] regarding the results in 2 weeks, please contact this office.      I personally performed the services described in this documentation, which was scribed in my presence. The recorded information has been reviewed and considered for accuracy and completeness, addended by me as needed, and agree with information above.  Signed,   [name], MD Primary Care at [facility].  07/25/17 12:36 PM

## 2017-07-25 NOTE — Patient Instructions (Addendum)
2 year card for DOT. No restrictions.     IF you received an x-ray today, you will receive an invoice from [facility]. Please contact [facility] at [phone] with questions or concerns regarding your invoice.   IF you received labwork today, you will receive an invoice from [facility]. Please contact [facility] at [phone] with questions or concerns regarding your invoice.   Our billing staff will not be able to assist you with questions regarding bills from these companies.  You will be contacted with the lab results as soon as they are available. The fastest way to get your results is to activate your My Chart account. Instructions are located on the last page of this paperwork. If you have not heard from us regarding the results in 2 weeks, please contact this office.

## 2017-08-05 ENCOUNTER — Encounter

## 2017-08-05 ENCOUNTER — Ambulatory Visit: Payer: BC Managed Care – PPO | Admitting: Family Medicine

## 2017-08-05 ENCOUNTER — Encounter: Payer: Self-pay | Admitting: Family Medicine

## 2017-08-05 VITALS — BP 126/80 | HR 79 | Temp 98.3°F | Resp 17 | Ht 72.5 in | Wt 241.0 lb

## 2017-08-05 DIAGNOSIS — J309 Allergic rhinitis, unspecified: Secondary | ICD-10-CM | POA: Diagnosis not present

## 2017-08-05 DIAGNOSIS — S80861A Insect bite (nonvenomous), right lower leg, initial encounter: Secondary | ICD-10-CM | POA: Diagnosis not present

## 2017-08-05 DIAGNOSIS — R0981 Nasal congestion: Secondary | ICD-10-CM | POA: Diagnosis not present

## 2017-08-05 DIAGNOSIS — W57XXXA Bitten or stung by nonvenomous insect and other nonvenomous arthropods, initial encounter: Secondary | ICD-10-CM | POA: Diagnosis not present

## 2017-08-05 MED ORDER — DOXYCYCLINE HYCLATE 100 MG PO TABS: 100.0000 mg | ORAL_TABLET | Freq: Two times a day (BID) | ORAL | 0 refills | Status: DC

## 2017-08-05 NOTE — Progress Notes (Signed)
Subjective:  By signing my name below, I, Stephen Cannon, attest that this documentation has been prepared under the direction and in the presence of Stephen Staggers, MD. Electronically Signed: Stann Cannon, Scribe. 08/05/2017 , 4:28 PM .  Patient was seen in Room 2 .   Patient ID: Stephen Cannon, male    DOB: Feb 05, 1956, 62 y.o.   MRN: [id number] Chief Complaint  Patient presents with  . Tick Removal  . Allergies   HPI Stephen Cannon is a 62 y.o. male  Patient is here for 2 issues: allergy symptoms and tick bite.   Tick bite Patient removed tick over his right medial calf yesterday. He states the tick was present for 1 day. After tick removal, he had a little crust over. He denies nausea, vomiting, headache, abdominal pain or measured fever.   Seasonal allergies Patient also complains having cough and sore throat after working and driving around 4 days ago. He stepped out of his truck and felt like something hit him, "like a wall". He initially thought he had inhaled something bad. He's been using NeoMed saline nasal wash twice a day, eventually broke loose. He hasn't taken any OTC allergy medications; zyrtec made him feel drowsy. He had some chills at night and a little warmth initially, with symptoms improving today. He's been coughing up yellow phlegm up in the morning.   Patient Active Problem List   Diagnosis Date Noted  . OA (osteoarthritis) of hip 04/16/2017  . Allergic rhinitis due to pollen 06/27/2014  . 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 Primary snoring 06/27/2014  . Snoring 05/31/2014  . Low testosterone 01/03/2014  . Allergic conjunctivitis 01/29/2012  . Hypercholesterolemia 08/01/2011   Past Medical History:  Diagnosis Date  . Arthritis   . Complication of anesthesia   . PONV (postoperative nausea and vomiting)    Past Surgical History:  Procedure Laterality Date  . APPENDECTOMY    . SINUS SURGERY WITH INSTATRAK    . 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 Marital status: Married    Spouse name: Not on file  . Number of children: Not on file  . Years of education: Not on file  . Highest education level: Not on file  Occupational History  . Not on file  Social Needs  . Financial resource strain: Not on file  . Food insecurity:    Worry: Not on file    Inability: Not on file  . Transportation needs:    Medical: Not on file    Non-medical: Not on file  Tobacco Use  . Smoking status: Former Smoker    Last attempt to quit: 04/08/1981    Years since quitting: 36.3  . Smokeless tobacco: Never Used  Substance and Sexual Activity  . Alcohol use: No    Alcohol/week: 0.0 oz  . Drug use: No  . Sexual activity: Yes  Lifestyle  . Physical activity:    Days per week: Not on file    Minutes per session: Not on file  . Stress: Not on file  Relationships  . Social connections:    Talks on phone: Not on file    Gets together: Not on file    Attends religious service: Not on file  Active member of club or organization: Not on file    Attends meetings of clubs or organizations: Not on file    Relationship status: Not on file  . Intimate partner violence:    Fear of current or ex partner: Not on file    Emotionally abused: Not on file    Physically abused: Not on file    Forced sexual activity: Not on file  Other Topics Concern  . Not on file  Social History Narrative   Caffeine 2 cups daily avg.   Review of Systems  Constitutional: Negative for chills, fatigue, fever and unexpected weight change.  HENT: Positive for congestion, sinus pressure and sore throat. Negative for rhinorrhea.   Eyes: Negative for visual disturbance.  Respiratory: Positive for cough. Negative for  chest tightness and shortness of breath.   Cardiovascular: Negative for chest pain, palpitations and leg swelling.  Gastrointestinal: Negative for abdominal pain, blood in stool, diarrhea, nausea and vomiting.  Skin: Positive for wound (tick bite, right medial calf). Negative for rash.  Allergic/Immunologic: Positive for environmental allergies.  Neurological: Negative for dizziness, light-headedness and headaches.       Objective:   Physical Exam  Constitutional: He is oriented to person, place, and time. He appears well-developed and well-nourished.  HENT:  Head: Normocephalic and atraumatic.  Right Ear: Tympanic membrane, external ear and ear canal normal.  Left Ear: Tympanic membrane, external ear and ear canal normal.  Nose: Mucosal edema (R>L) present. No rhinorrhea. Right sinus exhibits maxillary sinus tenderness (minimal pressure). Right sinus exhibits no frontal sinus tenderness. Left sinus exhibits maxillary sinus tenderness (minimal pressure). Left sinus exhibits no frontal sinus tenderness.  Mouth/Throat: Oropharynx is clear and moist and mucous membranes are normal. No oropharyngeal exudate or posterior oropharyngeal erythema.  Eyes: Pupils are equal, round, and reactive to light. Conjunctivae are normal.  Neck: Neck supple.  Cardiovascular: Normal rate, regular rhythm, normal heart sounds and intact distal pulses.  No murmur heard. Pulmonary/Chest: Effort normal and breath sounds normal. He has no wheezes. He has no rhonchi. He has no rales.  Abdominal: Soft. There is no tenderness.  Lymphadenopathy:    He has no cervical adenopathy.  Neurological: He is alert and oriented to person, place, and time.  Skin: Skin is warm and dry. No rash noted.  Right medial calf: small abrasion approximately 2mm centrally, 1-57mm of erythema, no remaining tick parts on up-close inspection, no red streaks, induration or active discharge   Psychiatric: He has a normal mood and affect. His  behavior is normal.  Vitals reviewed.   Vitals:   08/05/17 1539  BP: 126/80  Pulse: 79  Resp: 17  Temp: 98.3 F (36.8 C)  TempSrc: Oral  SpO2: 98%  Weight: 241 lb (109.3 kg)  Height: 6' 0.5" (1.842 m)       Assessment & Plan:    Stephen Cannon is a 62 y.o. male Sinus congestion - Plan: doxycycline (VIBRA-TABS) 100 MG tablet Allergic rhinitis, unspecified seasonality, unspecified trigger  -Sinus congestion likely due to allergies at this time, less likely sinus infection even with discolored nasal discharge.  Some improvement today.  Continue symptomatic care with saline wash, Claritin or Allegra over-the-counter, Flonase nasal spray discussed.  If discolored nasal discharge not improving the next 5 to 7 days, can start doxycycline that was printed today.  Tick bite of right calf, initial encounter - Plan: doxycycline (VIBRA-TABS) 100 MG tablet  -Based on history tick was implanted less than 1 day, no signs  of surrounding cellulitis at this time, suspect this area will continue to improve, however if increased erythema from previous tick bite removal, differential includes cellulitis and doxycycline was printed as above.  RTC precautions  Meds ordered this encounter  Medications  . doxycycline (VIBRA-TABS) 100 MG tablet    Sig: Take 1 tablet (100 mg total) by mouth 2 (two) times daily.    Dispense:  20 tablet    Refill:  0   Patient Instructions    Tick appears to have been removed effectively without signs of infection at this time. If any increased redness, can start the antibiotic doxycycline.  If you have any fever, new headaches, rash, abdominal pain, or other worsening symptoms please recheck right away.  Flonase nasal spray, allegra or claritin can help allergies which is likely cause of congestion at this time.  If discolored mucus is not improving in next 4-5 days, can start doxycycline.  Return to the clinic or go to the nearest emergency room if any of your  symptoms worsen or new symptoms occur.  Allergic Rhinitis, Adult Allergic rhinitis is an allergic reaction that affects the mucous membrane inside the nose. It causes sneezing, a runny or stuffy nose, and the feeling of mucus going down the back of the throat (postnasal drip). Allergic rhinitis can be mild to severe. There are two types of allergic rhinitis:  Seasonal. This type is also called hay fever. It happens only during certain seasons.  Perennial. This type can happen at any time of the year.  What are the causes? This condition happens when the body's defense system (immune system) responds to certain harmless substances called allergens as though they were germs.  Seasonal allergic rhinitis is triggered by pollen, which can come from grasses, trees, and weeds. Perennial allergic rhinitis may be caused by:  House dust mites.  Pet dander.  Mold spores.  What are the signs or symptoms? Symptoms of this condition include:  Sneezing.  Runny or stuffy nose (nasal congestion).  Postnasal drip.  Itchy nose.  Tearing of the eyes.  Trouble sleeping.  Daytime sleepiness.  How is this diagnosed? This condition may be diagnosed based on:  Your medical history.  A physical exam.  Tests to check for related conditions, such as: ? Asthma. ? Pink eye. ? Ear infection. ? Upper respiratory infection.  Tests to find out which allergens trigger your symptoms. These may include skin or blood tests.  How is this treated? There is no cure for this condition, but treatment can help control symptoms. Treatment may include:  Taking medicines that block allergy symptoms, such as antihistamines. Medicine may be given as a shot, nasal spray, or pill.  Avoiding the allergen.  Desensitization. This treatment involves getting ongoing shots until your body becomes less sensitive to the allergen. This treatment may be done if other treatments do not help.  If taking medicine and  avoiding the allergen does not work, new, stronger medicines may be prescribed.  Follow these instructions at home:  Find out what you are allergic to. Common allergens include smoke, dust, and pollen.  Avoid the things you are allergic to. These are some things you can do to help avoid allergens: ? Replace carpet with wood, tile, or vinyl flooring. Carpet can trap dander and dust. ? Do not smoke. Do not allow smoking in your home. ? Change your heating and air conditioning filter at least once a month. ? During allergy season:  Keep windows closed as much  as possible.  Plan outdoor activities when pollen counts are lowest. This is usually during the evening hours.  When coming indoors, change clothing and shower before sitting on furniture or bedding.  Take over-the-counter and prescription medicines only as told by your health care provider.  Keep all follow-up visits as told by your health care provider. This is important. Contact a health care provider if:  You have a fever.  You develop a persistent cough.  You make whistling sounds when you breathe (you wheeze).  Your symptoms interfere with your normal daily activities. Get help right away if:  You have shortness of breath. Summary  This condition can be managed by taking medicines as directed and avoiding allergens.  Contact your health care provider if you develop a persistent cough or fever.  During allergy season, keep windows closed as much as possible. This information is not intended to replace advice given to you by your health care provider. Make sure you discuss any questions you have with your health care provider. Document Released: 12/18/2000 Document Revised: 05/02/2016 Document Reviewed: 05/02/2016 Elsevier Interactive Patient Education  2018 [employer].     Tick Bite Information, Adult Ticks are insects that can bite. Most ticks live in shrubs and grassy areas. They climb onto people and  animals that go by. Then they bite. Some ticks carry germs that can make you sick. How can I prevent tick bites?  Use an insect repellent that has 20% or higher of the ingredients DEET, picaridin, or IR3535. Put this insect repellent on: ? Bare skin. ? The tops of your boots. ? Your pant legs. ? The ends of your sleeves.  If you use an insect repellent that has the ingredient permethrin, make sure to follow the instructions on the bottle. Treat the following: ? Clothing. ? Supplies. ? Boots. ? Tents.  Wear long sleeves, long pants, and light colors.  Tuck your pant legs into your socks.  Stay in the middle of the trail.  Try not to walk through long grass.  Before going inside your house, check your clothes, hair, and skin for ticks. Make sure to check your head, neck, armpits, waist, groin, and joint areas.  Check for ticks every day.  When you come indoors: ? Wash your clothes right away. ? Shower right away. ? Dry your clothes in a dryer on high heat for 60 minutes or more. What is the right way to remove a tick? Remove a tick from your skin as soon as possible.  To remove a tick that is crawling on your skin: ? Go outdoors and brush the tick off. ? Use tape or a lint roller.  To remove a tick that is biting: ? Wash your hands. ? If you have latex gloves, put them on. ? Use tweezers, curved forceps, or a tick-removal tool to grasp the tick. Grasp the tick as close to your skin and as close to the tick's head as possible. ? Gently pull up until the tick lets go.  Try to keep the tick's head attached to its body.  Do not twist or jerk the tick.  Do not squeeze or crush the tick.  Do not try to remove a tick with heat, alcohol, petroleum jelly, or fingernail polish. How should I get rid of a tick? Here are some ways to get rid of a tick that is alive:  Place the tick in rubbing alcohol.  Place the tick in a bag or container you can  close tightly.  Wrap the  tick tightly in tape.  Flush the tick down the toilet.  Contact a doctor if:  You have symptoms of a disease, such as: ? Pain in a muscle, joint, or bone. ? Trouble walking or moving your legs. ? Numbness in your legs. ? Inability to move (paralysis). ? A red rash that makes a circle (bull's-eye rash). ? Redness and swelling where the tick bit you. ? A fever. ? Throwing up (vomiting) over and over. ? Diarrhea. ? Weight loss. ? Tender and swollen lymph glands. ? Shortness of breath. ? Cough. ? Belly pain (abdominal pain). ? Headache. ? Being more tired than normal. ? A change in how alert (conscious) you are. ? Confusion. Get help right away if:  You cannot remove a tick.  A part of a tick breaks off and gets stuck in your skin.  You are feeling worse. Summary  Ticks may carry germs that can make you sick.  To prevent tick bites, wear long sleeves, long pants, and light colors. Use insect repellent. Follow the instructions on the bottle.  If the tick is biting, do not try to remove it with heat, alcohol, petroleum jelly, or fingernail polish.  Use tweezers, curved forceps, or a tick-removal tool to grasp the tick. Gently pull up until the tick lets go. Do not twist or jerk the tick. Do not squeeze or crush the tick.  If you have symptoms, contact a doctor. This information is not intended to replace advice given to you by your health care provider. Make sure you discuss any questions you have with your health care provider. Document Released: 06/19/2009 Document Revised: 07/05/2016 Document Reviewed: 07/05/2016 Elsevier Interactive Patient Education  2018 [employer].     IF you received an x-ray today, you will receive an invoice from [facility]. Please contact [facility] at [phone] with questions or concerns regarding your invoice.   IF you received labwork today, you will receive an invoice from [place]. Please contact [facility] at  [phone] with questions or concerns regarding your invoice.   Our billing staff will not be able to assist you with questions regarding bills from these companies.  You will be contacted with the lab results as soon as they are available. The fastest way to get your results is to activate your My Chart account. Instructions are located on the last page of this paperwork. If you have not heard from [place] regarding the results in 2 weeks, please contact this office.       I personally performed the services described in this documentation, which was scribed in my presence. The recorded information has been reviewed and considered for accuracy and completeness, addended by me as needed, and agree with information above.  Signed,   Stephen Staggers, MD [facility].  08/05/17 4:33 PM

## 2017-08-05 NOTE — Patient Instructions (Addendum)
Tick appears to have been removed effectively without signs of infection at this time. If any increased redness, can start the antibiotic doxycycline.  If you have any fever, new headaches, rash, abdominal pain, or other worsening symptoms please recheck right away.  Flonase nasal spray, allegra or claritin can help allergies which is likely cause of congestion at this time.  If discolored mucus is not improving in next 4-5 days, can start doxycycline.  Return to the clinic or go to the nearest emergency room if any of your symptoms worsen or new symptoms occur.  Allergic Rhinitis, Adult Allergic rhinitis is an allergic reaction that affects the mucous membrane inside the nose. It causes sneezing, a runny or stuffy nose, and the feeling of mucus going down the back of the throat (postnasal drip). Allergic rhinitis can be mild to severe. There are two types of allergic rhinitis:  Seasonal. This type is also called hay fever. It happens only during certain seasons.  Perennial. This type can happen at any time of the year.  What are the causes? This condition happens when the body's defense system (immune system) responds to certain harmless substances called allergens as though they were germs.  Seasonal allergic rhinitis is triggered by pollen, which can come from grasses, trees, and weeds. Perennial allergic rhinitis may be caused by:  House dust mites.  Pet dander.  Mold spores.  What are the signs or symptoms? Symptoms of this condition include:  Sneezing.  Runny or stuffy nose (nasal congestion).  Postnasal drip.  Itchy nose.  Tearing of the eyes.  Trouble sleeping.  Daytime sleepiness.  How is this diagnosed? This condition may be diagnosed based on:  Your medical history.  A physical exam.  Tests to check for related conditions, such as: ? Asthma. ? Pink eye. ? Ear infection. ? Upper respiratory infection.  Tests to find out which allergens trigger your  symptoms. These may include skin or blood tests.  How is this treated? There is no cure for this condition, but treatment can help control symptoms. Treatment may include:  Taking medicines that block allergy symptoms, such as antihistamines. Medicine may be given as a shot, nasal spray, or pill.  Avoiding the allergen.  Desensitization. This treatment involves getting ongoing shots until your body becomes less sensitive to the allergen. This treatment may be done if other treatments do not help.  If taking medicine and avoiding the allergen does not work, new, stronger medicines may be prescribed.  Follow these instructions at home:  Find out what you are allergic to. Common allergens include smoke, dust, and pollen.  Avoid the things you are allergic to. These are some things you can do to help avoid allergens: ? Replace carpet with wood, tile, or vinyl flooring. Carpet can trap dander and dust. ? Do not smoke. Do not allow smoking in your home. ? Change your heating and air conditioning filter at least once a month. ? During allergy season:  Keep windows closed as much as possible.  Plan outdoor activities when pollen counts are lowest. This is usually during the evening hours.  When coming indoors, change clothing and shower before sitting on furniture or bedding.  Take over-the-counter and prescription medicines only as told by your health care provider.  Keep all follow-up visits as told by your health care provider. This is important. Contact a health care provider if:  You have a fever.  You develop a persistent cough.  You make whistling sounds when you  breathe (you wheeze).  Your symptoms interfere with your normal daily activities. Get help right away if:  You have shortness of breath. Summary  This condition can be managed by taking medicines as directed and avoiding allergens.  Contact your health care provider if you develop a persistent cough or  fever.  During allergy season, keep windows closed as much as possible. This information is not intended to replace advice given to you by your health care provider. Make sure you discuss any questions you have with your health care provider. Document Released: 12/18/2000 Document Revised: 05/02/2016 Document Reviewed: 05/02/2016 Elsevier Interactive Patient Education  2018 [employer].     Tick Bite Information, Adult Ticks are insects that can bite. Most ticks live in shrubs and grassy areas. They climb onto people and animals that go by. Then they bite. Some ticks carry germs that can make you sick. How can I prevent tick bites?  Use an insect repellent that has 20% or higher of the ingredients DEET, picaridin, or IR3535. Put this insect repellent on: ? Bare skin. ? The tops of your boots. ? Your pant legs. ? The ends of your sleeves.  If you use an insect repellent that has the ingredient permethrin, make sure to follow the instructions on the bottle. Treat the following: ? Clothing. ? Supplies. ? Boots. ? Tents.  Wear long sleeves, long pants, and light colors.  Tuck your pant legs into your socks.  Stay in the middle of the trail.  Try not to walk through long grass.  Before going inside your house, check your clothes, hair, and skin for ticks. Make sure to check your head, neck, armpits, waist, groin, and joint areas.  Check for ticks every day.  When you come indoors: ? Wash your clothes right away. ? Shower right away. ? Dry your clothes in a dryer on high heat for 60 minutes or more. What is the right way to remove a tick? Remove a tick from your skin as soon as possible.  To remove a tick that is crawling on your skin: ? Go outdoors and brush the tick off. ? Use tape or a lint roller.  To remove a tick that is biting: ? Wash your hands. ? If you have latex gloves, put them on. ? Use tweezers, curved forceps, or a tick-removal tool to grasp the tick.  Grasp the tick as close to your skin and as close to the tick's head as possible. ? Gently pull up until the tick lets go.  Try to keep the tick's head attached to its body.  Do not twist or jerk the tick.  Do not squeeze or crush the tick.  Do not try to remove a tick with heat, alcohol, petroleum jelly, or fingernail polish. How should I get rid of a tick? Here are some ways to get rid of a tick that is alive:  Place the tick in rubbing alcohol.  Place the tick in a bag or container you can close tightly.  Wrap the tick tightly in tape.  Flush the tick down the toilet.  Contact a doctor if:  You have symptoms of a disease, such as: ? Pain in a muscle, joint, or bone. ? Trouble walking or moving your legs. ? Numbness in your legs. ? Inability to move (paralysis). ? A red rash that makes a circle (bull's-eye rash). ? Redness and swelling where the tick bit you. ? A fever. ? Throwing up (vomiting) over and over. ?  Diarrhea. ? Weight loss. ? Tender and swollen lymph glands. ? Shortness of breath. ? Cough. ? Belly pain (abdominal pain). ? Headache. ? Being more tired than normal. ? A change in how alert (conscious) you are. ? Confusion. Get help right away if:  You cannot remove a tick.  A part of a tick breaks off and gets stuck in your skin.  You are feeling worse. Summary  Ticks may carry germs that can make you sick.  To prevent tick bites, wear long sleeves, long pants, and light colors. Use insect repellent. Follow the instructions on the bottle.  If the tick is biting, do not try to remove it with heat, alcohol, petroleum jelly, or fingernail polish.  Use tweezers, curved forceps, or a tick-removal tool to grasp the tick. Gently pull up until the tick lets go. Do not twist or jerk the tick. Do not squeeze or crush the tick.  If you have symptoms, contact a doctor. This information is not intended to replace advice given to you by your health care  provider. Make sure you discuss any questions you have with your health care provider. Document Released: 06/19/2009 Document Revised: 07/05/2016 Document Reviewed: 07/05/2016 Elsevier Interactive Patient Education  2018 [employer].     IF you received an x-ray today, you will receive an invoice from [facility]. Please contact [facility] at [phone] with questions or concerns regarding your invoice.   IF you received labwork today, you will receive an invoice from [facility]. Please contact [facility] at [phone] with questions or concerns regarding your invoice.   Our billing staff will not be able to assist you with questions regarding bills from these companies.  You will be contacted with the lab results as soon as they are available. The fastest way to get your results is to activate your My Chart account. Instructions are located on the last page of this paperwork. If you have not heard from [place] regarding the results in 2 weeks, please contact this office.

## 2018-05-26 ENCOUNTER — Encounter: Payer: Self-pay | Admitting: Family Medicine

## 2018-05-26 ENCOUNTER — Ambulatory Visit: Payer: BC Managed Care – PPO | Admitting: Family Medicine

## 2018-05-26 ENCOUNTER — Other Ambulatory Visit: Payer: Self-pay

## 2018-05-26 VITALS — BP 130/81 | HR 86 | Temp 98.5°F | Resp 16 | Ht 72.0 in | Wt 246.2 lb

## 2018-05-26 DIAGNOSIS — Z23 Encounter for immunization: Secondary | ICD-10-CM | POA: Diagnosis not present

## 2018-05-26 DIAGNOSIS — J0110 Acute frontal sinusitis, unspecified: Secondary | ICD-10-CM

## 2018-05-26 DIAGNOSIS — R0981 Nasal congestion: Secondary | ICD-10-CM | POA: Diagnosis not present

## 2018-05-26 DIAGNOSIS — H5789 Other specified disorders of eye and adnexa: Secondary | ICD-10-CM

## 2018-05-26 DIAGNOSIS — Z8669 Personal history of other diseases of the nervous system and sense organs: Secondary | ICD-10-CM | POA: Diagnosis not present

## 2018-05-26 MED ORDER — AMOXICILLIN-POT CLAVULANATE 875-125 MG PO TABS: 1.0000 | ORAL_TABLET | Freq: Two times a day (BID) | ORAL | 0 refills | Status: DC

## 2018-05-26 NOTE — Progress Notes (Signed)
Subjective:    Patient ID: Stephen Cannon, male    DOB: 05-Jun-1955, 63 y.o.   MRN: [id number]  HPI Stephen Cannon is a 63 y.o. male Presents today for: Chief Complaint  Patient presents with  . Sinusitis    temple pain, head congestion, sinus pressure, feel drained. Have been using nasal rinse which has been helping. Last seen here for sinusitis in April, was given abx which did help. Had sinus surgery 2-3 yrs ago   Sinus pain and pressure. Notices pain in frontal sinus. Pain past 4 days. historirically left sinus - L cheek and forehead stays blocked, then opens up as day goes on typically. R feels more blocked today.  No fever, but some body aches this morning. Chronic sinus issues, with sinus surgery few years ago. Helped some. Recent change in symptoms - yellow nasal d/c past few days.  L eye started watering few days ago, with some redness L eye and around eye. Redness has improved. Vision ok - no changes. Has had some chronic eye watering.  No hx glaucoma, but no recent optho eval. Last seen by Dr. Dione Booze a few years ago. ? uveiitis in past - treated in past by optho at [facility]. Seeing ok at present.  Few weeks ago - blew nose and blood clot, followed by nosebleed on left. No recurrence past few days.   April 2019 seen for sinus congestion. Allergy treatment discussed, but doxycycline if not improved - not sure if he took abx at that time.   ENT: [facility].  tx: neil med sinus wash past few days. Helped some last night for left side.    Patient Active Problem List   Diagnosis Date Noted  . OA (osteoarthritis) of hip 04/16/2017  . Allergic rhinitis due to pollen 06/27/2014  . 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 Primary snoring 06/27/2014  . Snoring 05/31/2014  . Low testosterone 01/03/2014  . Allergic conjunctivitis 01/29/2012  . Hypercholesterolemia 08/01/2011   Past Medical History:  Diagnosis Date  . Arthritis   . Complication of anesthesia   . PONV (postoperative nausea and  vomiting)    Past Surgical History:  Procedure Laterality Date  . APPENDECTOMY    . SINUS SURGERY WITH INSTATRAK    . 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 Marital status: Married    Spouse name: Not on file  . Number of children: Not on file  . Years of education: Not on file  . Highest education level: Not on file  Occupational History  . Not on file  Social Needs  . Financial resource strain: Not on file  . Food insecurity:    Worry: Not on file    Inability: Not on file  . Transportation needs:    Medical: Not on file    Non-medical: Not on file  Tobacco Use  . Smoking status: Former Smoker    Last attempt to quit: 04/08/1981    Years since quitting: 37.1  . Smokeless tobacco: Never Used  Substance and Sexual Activity  . Alcohol use: No    Alcohol/week: 0.0 standard drinks  . Drug use: No  . Sexual activity: Yes  Lifestyle  . Physical activity:    Days per week: Not  on file    Minutes per session: Not on file  . Stress: Not on file  Relationships  . Social connections:    Talks on phone: Not on file    Gets together: Not on file    Attends religious service: Not on file    Active member of club or organization: Not on file    Attends meetings of clubs or organizations: Not on file    Relationship status: Not on file  . Intimate partner violence:    Fear of current or ex partner: Not on file    Emotionally abused: Not on file    Physically abused: Not on file    Forced sexual activity: Not on file  Other Topics Concern  . Not on file  Social History Narrative   Caffeine 2 cups daily avg.    Review of Systems Per HPI.     Objective:   Physical Exam Vitals signs reviewed.  Constitutional:       Appearance: He is well-developed.  HENT:     Head: Normocephalic and atraumatic.     Right Ear: Tympanic membrane, ear canal and external ear normal.     Left Ear: Tympanic membrane, ear canal and external ear normal.     Nose: No rhinorrhea.     Right Sinus: Maxillary sinus tenderness and frontal sinus tenderness present.     Left Sinus: Frontal sinus tenderness present. No maxillary sinus tenderness.     Mouth/Throat:     Pharynx: No oropharyngeal exudate or posterior oropharyngeal erythema.  Eyes:     General: Lids are normal.        Right eye: No discharge.        Left eye: No discharge.     Extraocular Movements: Extraocular movements intact.     Conjunctiva/sclera: Conjunctivae normal.     Pupils: Pupils are equal, round, and reactive to light.     Comments: Minimal infraorbital edema on left but no apparent surrounding erythema.  No discharge.  Anterior chambers clear.  No apparent injection/ciliary flare on exam at present  Neck:     Musculoskeletal: Neck supple.  Cardiovascular:     Rate and Rhythm: Normal rate and regular rhythm.     Heart sounds: Normal heart sounds. No murmur.  Pulmonary:     Effort: Pulmonary effort is normal.     Breath sounds: Normal breath sounds. No wheezing, rhonchi or rales.  Abdominal:     Palpations: Abdomen is soft.     Tenderness: There is no abdominal tenderness.  Lymphadenopathy:     Cervical: No cervical adenopathy.  Skin:    General: Skin is warm and dry.     Findings: No rash.  Neurological:     Mental Status: He is alert and oriented to person, place, and time.  Psychiatric:        Behavior: Behavior normal.    . Vitals:   05/26/18 1119  BP: 130/81  Pulse: 86  Resp: 16  Temp: 98.5 F (36.9 C)  TempSrc: Oral  SpO2: 96%  Weight: 246 lb 3.2 oz (111.7 kg)  Height: 6' (1.829 m)     Visual Acuity Screening   Right eye Left eye Both eyes  Without correction: 20/15 20/40 20/20   With correction:     vision screen 2017  at [employer] physical  20/25 on left.      Assessment & Plan:    Stephen Cannon is a 63 y.o. male Acute frontal sinusitis, recurrence not specified -  Plan: amoxicillin-clavulanate (AUGMENTIN) 875-125 MG tablet Sinus congestion  -Chronic sinus disease with likely acute flare, but also some allergic symptoms.  Appears to have frontal sinusitis at present.  -Start Augmentin, potential side effects discussed.    -Flonase nasal spray for allergies, continue saline spray/nasal washings for sinus symptoms  -If not improving, or recurrence would recommend follow-up again with ENT.  RTC precautions if worse  History of uveitis - Plan: Ambulatory referral to Ophthalmology Eye redness - Plan: Ambulatory referral to Ophthalmology  -Reports eye watering, but some bilateral symptoms, left greater than right.  Reported history of uveitis, no apparent erythema at present but did report redness few days ago.  Decreased visual acuity on left.  And right.  Denies history of glaucoma, but did recommend eval with ophthalmology next few days, referral placed.  Need for prophylactic vaccination and inoculation against influenza - Plan: Flu Vaccine QUAD 6+ mos PF IM (Fluarix Quad PF)     Meds ordered this encounter  Medications  . amoxicillin-clavulanate (AUGMENTIN) 875-125 MG tablet    Sig: Take 1 tablet by mouth 2 (two) times daily.    Dispense:  20 tablet    Refill:  0   Patient Instructions     I suspect you have a flare of sinusitis at this time along with previous allergies.  Start Augmentin.  Okay to restart Flonase nasal spray 1 spray in each nostril each day to help with allergy symptoms.  Okay to continue saline/nasal washes.  For previous uveitis and left eye symptoms I did refer you to ophthalmology for further testing. Hopefully they will be able to see you in the next few days.  If any return of redness, or any vision changes/worsening, be seen right away.   Sinusitis, Adult Sinusitis is  inflammation of your sinuses. Sinuses are hollow spaces in the bones around your face. Your sinuses are located:  Around your eyes.  In the middle of your forehead.  Behind your nose.  In your cheekbones. Mucus normally drains out of your sinuses. When your nasal tissues become inflamed or swollen, mucus can become trapped or blocked. This allows bacteria, viruses, and fungi to grow, which leads to infection. Most infections of the sinuses are caused by a virus. Sinusitis can develop quickly. It can last for up to 4 weeks (acute) or for more than 12 weeks (chronic). Sinusitis often develops after a cold. What are the causes? This condition is caused by anything that creates swelling in the sinuses or stops mucus from draining. This includes:  Allergies.  Asthma.  Infection from bacteria or viruses.  Deformities or blockages in your nose or sinuses.  Abnormal growths in the nose (nasal polyps).  Pollutants, such as chemicals or irritants in the air.  Infection from fungi (rare). What increases the risk? You are more likely to develop this condition if you:  Have a weak body defense system (immune system).  Do a lot of swimming or diving.  Overuse nasal sprays.  Smoke. What are the signs or symptoms? The main symptoms of this condition are pain and a feeling of pressure around the affected sinuses. Other symptoms include:  Stuffy nose or congestion.  Thick drainage from your nose.  Swelling and warmth over the affected sinuses.  Headache.  Upper toothache.  A cough that may get worse at night.  Extra mucus that collects in the throat or the back of the nose (postnasal drip).  Decreased sense of smell and taste.  Fatigue.  A  fever.  Sore throat.  Bad breath. How is this diagnosed? This condition is diagnosed based on:  Your symptoms.  Your medical history.  A physical exam.  Tests to find out if your condition is acute or chronic. This may  include: ? Checking your nose for nasal polyps. ? Viewing your sinuses using a device that has a light (endoscope). ? Testing for allergies or bacteria. ? Imaging tests, such as an MRI or CT scan. In rare cases, a bone biopsy may be done to rule out more serious types of fungal sinus disease. How is this treated? Treatment for sinusitis depends on the cause and whether your condition is chronic or acute.  If caused by a virus, your symptoms should go away on their own within 10 days. You may be given medicines to relieve symptoms. They include: ? Medicines that shrink swollen nasal passages (topical intranasal decongestants). ? Medicines that treat allergies (antihistamines). ? A spray that eases inflammation of the nostrils (topical intranasal corticosteroids). ? Rinses that help get rid of thick mucus in your nose (nasal saline washes).  If caused by bacteria, your health care provider may recommend waiting to see if your symptoms improve. Most bacterial infections will get better without antibiotic medicine. You may be given antibiotics if you have: ? A severe infection. ? A weak immune system.  If caused by narrow nasal passages or nasal polyps, you may need to have surgery. Follow these instructions at home: Medicines  Take, use, or apply over-the-counter and prescription medicines only as told by your health care provider. These may include nasal sprays.  If you were prescribed an antibiotic medicine, take it as told by your health care provider. Do not stop taking the antibiotic even if you start to feel better. Hydrate and humidify   Drink enough fluid to keep your urine pale yellow. Staying hydrated will help to thin your mucus.  Use a cool mist humidifier to keep the humidity level in your home above 50%.  Inhale steam for 10-15 minutes, 3-4 times a day, or as told by your health care provider. You can do this in the bathroom while a hot shower is running.  Limit your  exposure to cool or dry air. Rest  Rest as much as possible.  Sleep with your head raised (elevated).  Make sure you get enough sleep each night. General instructions   Apply a warm, moist washcloth to your face 3-4 times a day or as told by your health care provider. This will help with discomfort.  Wash your hands often with soap and water to reduce your exposure to germs. If soap and water are not available, use hand sanitizer.  Do not smoke. Avoid being around people who are smoking (secondhand smoke).  Keep all follow-up visits as told by your health care provider. This is important. Contact a health care provider if:  You have a fever.  Your symptoms get worse.  Your symptoms do not improve within 10 days. Get help right away if:  You have a severe headache.  You have persistent vomiting.  You have severe pain or swelling around your face or eyes.  You have vision problems.  You develop confusion.  Your neck is stiff.  You have trouble breathing. Summary  Sinusitis is soreness and inflammation of your sinuses. Sinuses are hollow spaces in the bones around your face.  This condition is caused by nasal tissues that become inflamed or swollen. The swelling traps or blocks the  flow of mucus. This allows bacteria, viruses, and fungi to grow, which leads to infection.  If you were prescribed an antibiotic medicine, take it as told by your health care provider. Do not stop taking the antibiotic even if you start to feel better.  Keep all follow-up visits as told by your health care provider. This is important. This information is not intended to replace advice given to you by your health care provider. Make sure you discuss any questions you have with your health care provider. Document Released: 03/25/2005 Document Revised: 08/25/2017 Document Reviewed: 08/25/2017 Elsevier Interactive Patient Education  [employer].   If you have lab work done today you  will be contacted with your lab results within the next 2 weeks.  If you have not heard from [place] then please contact us. The fastest way to get your results is to register for My Chart.   IF you received an x-ray today, you will receive an invoice from [facility]. Please contact [facility] at [phone] with questions or concerns regarding your invoice.   IF you received labwork today, you will receive an invoice from [place]. Please contact [facility] at [phone] with questions or concerns regarding your invoice.   Our billing staff will not be able to assist you with questions regarding bills from these companies.  You will be contacted with the lab results as soon as they are available. The fastest way to get your results is to activate your My Chart account. Instructions are located on the last page of this paperwork. If you have not heard from [place] regarding the results in 2 weeks, please contact this office.       Signed,   [name], MD Primary [facility] at [facility].  05/26/18 12:18 PM

## 2018-05-26 NOTE — Patient Instructions (Addendum)
I suspect you have a flare of sinusitis at this time along with previous allergies.  Start Augmentin.  Okay to restart Flonase nasal spray 1 spray in each nostril each day to help with allergy symptoms.  Okay to continue saline/nasal washes.  For previous uveitis and left eye symptoms I did refer you to ophthalmology for further testing. Hopefully they will be able to see you in the next few days.  If any return of redness, or any vision changes/worsening, be seen right away.   Sinusitis, Adult Sinusitis is inflammation of your sinuses. Sinuses are hollow spaces in the bones around your face. Your sinuses are located:  Around your eyes.  In the middle of your forehead.  Behind your nose.  In your cheekbones. Mucus normally drains out of your sinuses. When your nasal tissues become inflamed or swollen, mucus can become trapped or blocked. This allows bacteria, viruses, and fungi to grow, which leads to infection. Most infections of the sinuses are caused by a virus. Sinusitis can develop quickly. It can last for up to 4 weeks (acute) or for more than 12 weeks (chronic). Sinusitis often develops after a cold. What are the causes? This condition is caused by anything that creates swelling in the sinuses or stops mucus from draining. This includes:  Allergies.  Asthma.  Infection from bacteria or viruses.  Deformities or blockages in your nose or sinuses.  Abnormal growths in the nose (nasal polyps).  Pollutants, such as chemicals or irritants in the air.  Infection from fungi (rare). What increases the risk? You are more likely to develop this condition if you:  Have a weak body defense system (immune system).  Do a lot of swimming or diving.  Overuse nasal sprays.  Smoke. What are the signs or symptoms? The main symptoms of this condition are pain and a feeling of pressure around the affected sinuses. Other symptoms include:  Stuffy nose or congestion.  Thick  drainage from your nose.  Swelling and warmth over the affected sinuses.  Headache.  Upper toothache.  A cough that may get worse at night.  Extra mucus that collects in the throat or the back of the nose (postnasal drip).  Decreased sense of smell and taste.  Fatigue.  A fever.  Sore throat.  Bad breath. How is this diagnosed? This condition is diagnosed based on:  Your symptoms.  Your medical history.  A physical exam.  Tests to find out if your condition is acute or chronic. This may include: ? Checking your nose for nasal polyps. ? Viewing your sinuses using a device that has a light (endoscope). ? Testing for allergies or bacteria. ? Imaging tests, such as an MRI or CT scan. In rare cases, a bone biopsy may be done to rule out more serious types of fungal sinus disease. How is this treated? Treatment for sinusitis depends on the cause and whether your condition is chronic or acute.  If caused by a virus, your symptoms should go away on their own within 10 days. You may be given medicines to relieve symptoms. They include: ? Medicines that shrink swollen nasal passages (topical intranasal decongestants). ? Medicines that treat allergies (antihistamines). ? A spray that eases inflammation of the nostrils (topical intranasal corticosteroids). ? Rinses that help get rid of thick mucus in your nose (nasal saline washes).  If caused by bacteria, your health care provider may recommend waiting to see if your symptoms improve. Most bacterial infections will get better without  antibiotic medicine. You may be given antibiotics if you have: ? A severe infection. ? A weak immune system.  If caused by narrow nasal passages or nasal polyps, you may need to have surgery. Follow these instructions at home: Medicines  Take, use, or apply over-the-counter and prescription medicines only as told by your health care provider. These may include nasal sprays.  If you were  prescribed an antibiotic medicine, take it as told by your health care provider. Do not stop taking the antibiotic even if you start to feel better. Hydrate and humidify   Drink enough fluid to keep your urine pale yellow. Staying hydrated will help to thin your mucus.  Use a cool mist humidifier to keep the humidity level in your home above 50%.  Inhale steam for 10-15 minutes, 3-4 times a day, or as told by your health care provider. You can do this in the bathroom while a hot shower is running.  Limit your exposure to cool or dry air. Rest  Rest as much as possible.  Sleep with your head raised (elevated).  Make sure you get enough sleep each night. General instructions   Apply a warm, moist washcloth to your face 3-4 times a day or as told by your health care provider. This will help with discomfort.  Wash your hands often with soap and water to reduce your exposure to germs. If soap and water are not available, use hand sanitizer.  Do not smoke. Avoid being around people who are smoking (secondhand smoke).  Keep all follow-up visits as told by your health care provider. This is important. Contact a health care provider if:  You have a fever.  Your symptoms get worse.  Your symptoms do not improve within 10 days. Get help right away if:  You have a severe headache.  You have persistent vomiting.  You have severe pain or swelling around your face or eyes.  You have vision problems.  You develop confusion.  Your neck is stiff.  You have trouble breathing. Summary  Sinusitis is soreness and inflammation of your sinuses. Sinuses are hollow spaces in the bones around your face.  This condition is caused by nasal tissues that become inflamed or swollen. The swelling traps or blocks the flow of mucus. This allows bacteria, viruses, and fungi to grow, which leads to infection.  If you were prescribed an antibiotic medicine, take it as told by your health care  provider. Do not stop taking the antibiotic even if you start to feel better.  Keep all follow-up visits as told by your health care provider. This is important. This information is not intended to replace advice given to you by your health care provider. Make sure you discuss any questions you have with your health care provider. Document Released: 03/25/2005 Document Revised: 08/25/2017 Document Reviewed: 08/25/2017 Elsevier Interactive Patient Education  [employer].   If you have lab work done today you will be contacted with your lab results within the next 2 weeks.  If you have not heard from [place] then please contact us. The fastest way to get your results is to register for My Chart.   IF you received an x-ray today, you will receive an invoice from [facility]. Please contact [facility] at [phone] with questions or concerns regarding your invoice.   IF you received labwork today, you will receive an invoice from [facility]. Please contact [facility] at [phone] with questions or concerns regarding your invoice.   Our  billing staff will not be able to assist you with questions regarding bills from these companies.  You will be contacted with the lab results as soon as they are available. The fastest way to get your results is to activate your My Chart account. Instructions are located on the last page of this paperwork. If you have not heard from us regarding the results in 2 weeks, please contact this office.      

## 2018-06-27 ENCOUNTER — Other Ambulatory Visit: Payer: Self-pay

## 2018-06-27 DIAGNOSIS — E7800 Pure hypercholesterolemia, unspecified: Secondary | ICD-10-CM

## 2018-07-02 ENCOUNTER — Encounter: Payer: BC Managed Care – PPO | Admitting: Family Medicine

## 2018-09-29 ENCOUNTER — Encounter: Payer: BC Managed Care – PPO | Admitting: Family Medicine

## 2018-10-02 ENCOUNTER — Encounter: Payer: Self-pay | Admitting: Family Medicine

## 2018-11-11 IMAGING — DX DG CHEST 2V
2 series · 2 of 2 positions shown · non-contrast
Comparison: 05/23/2016

CLINICAL DATA: Preoperative exam, follow-up atelectasis

EXAM:
CHEST  2 VIEW

[chest pa]
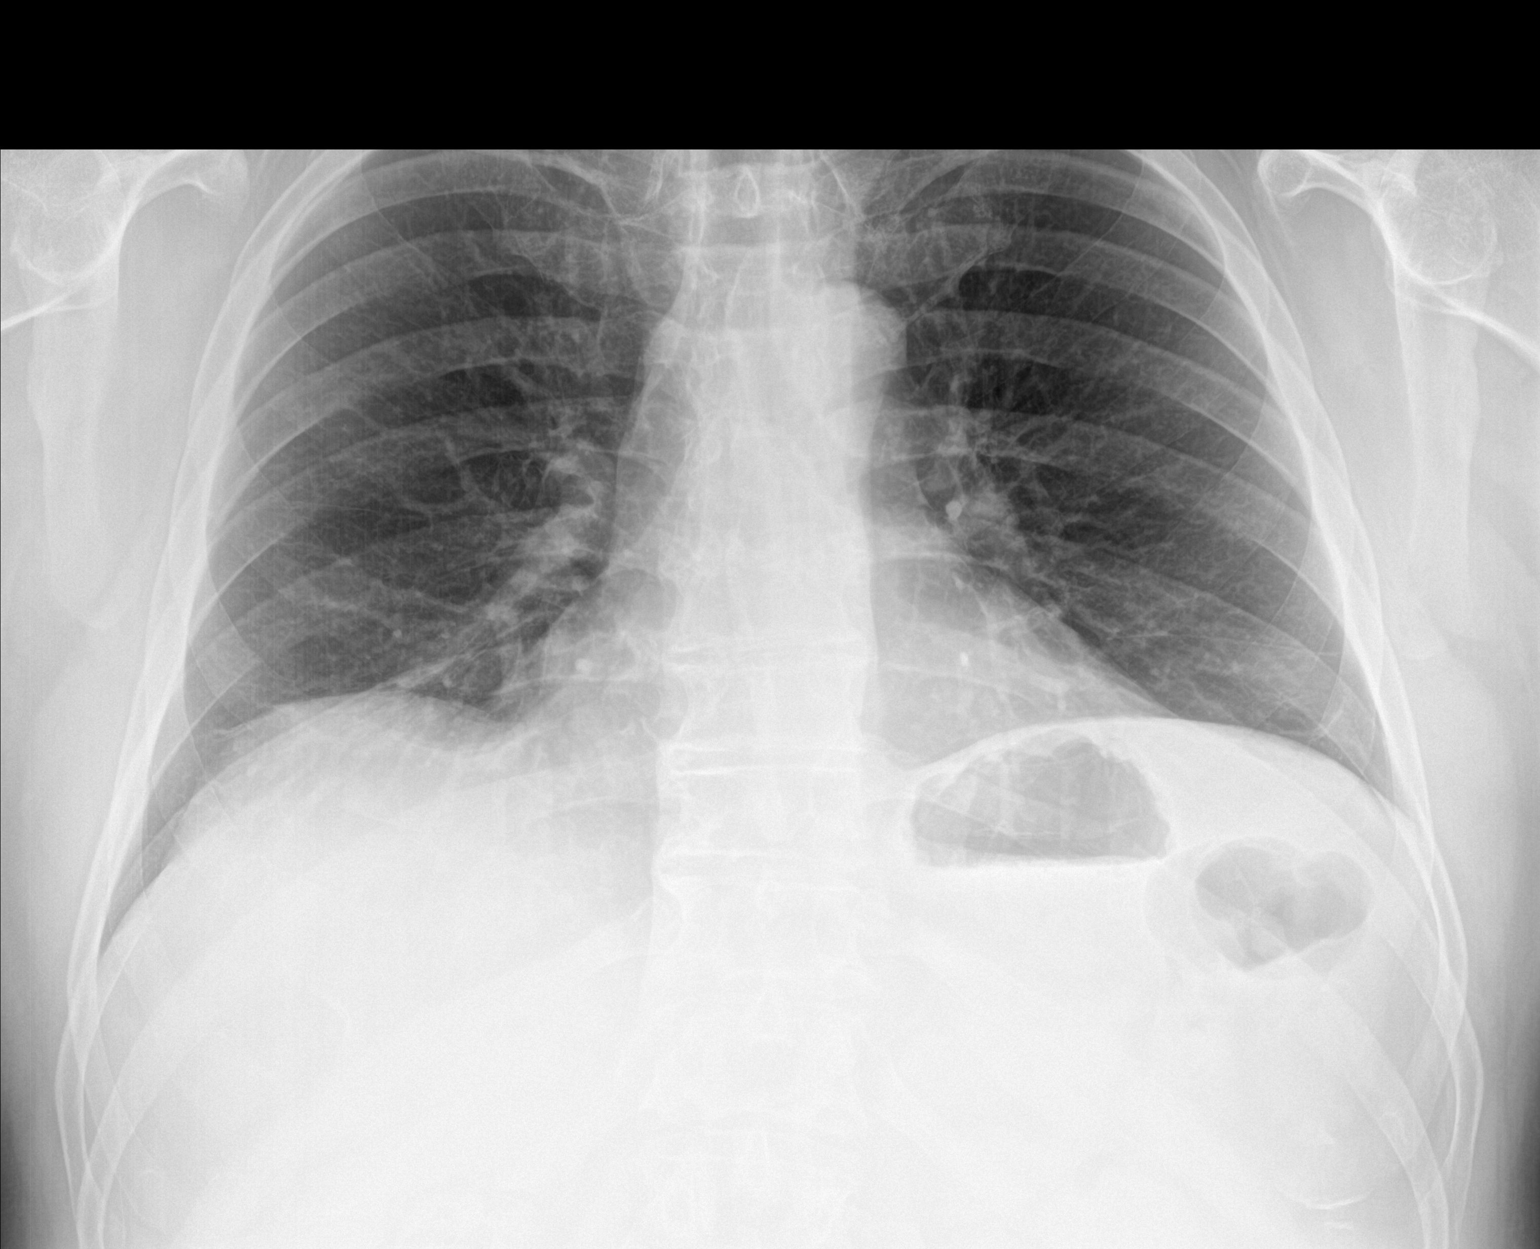

[chest lat]
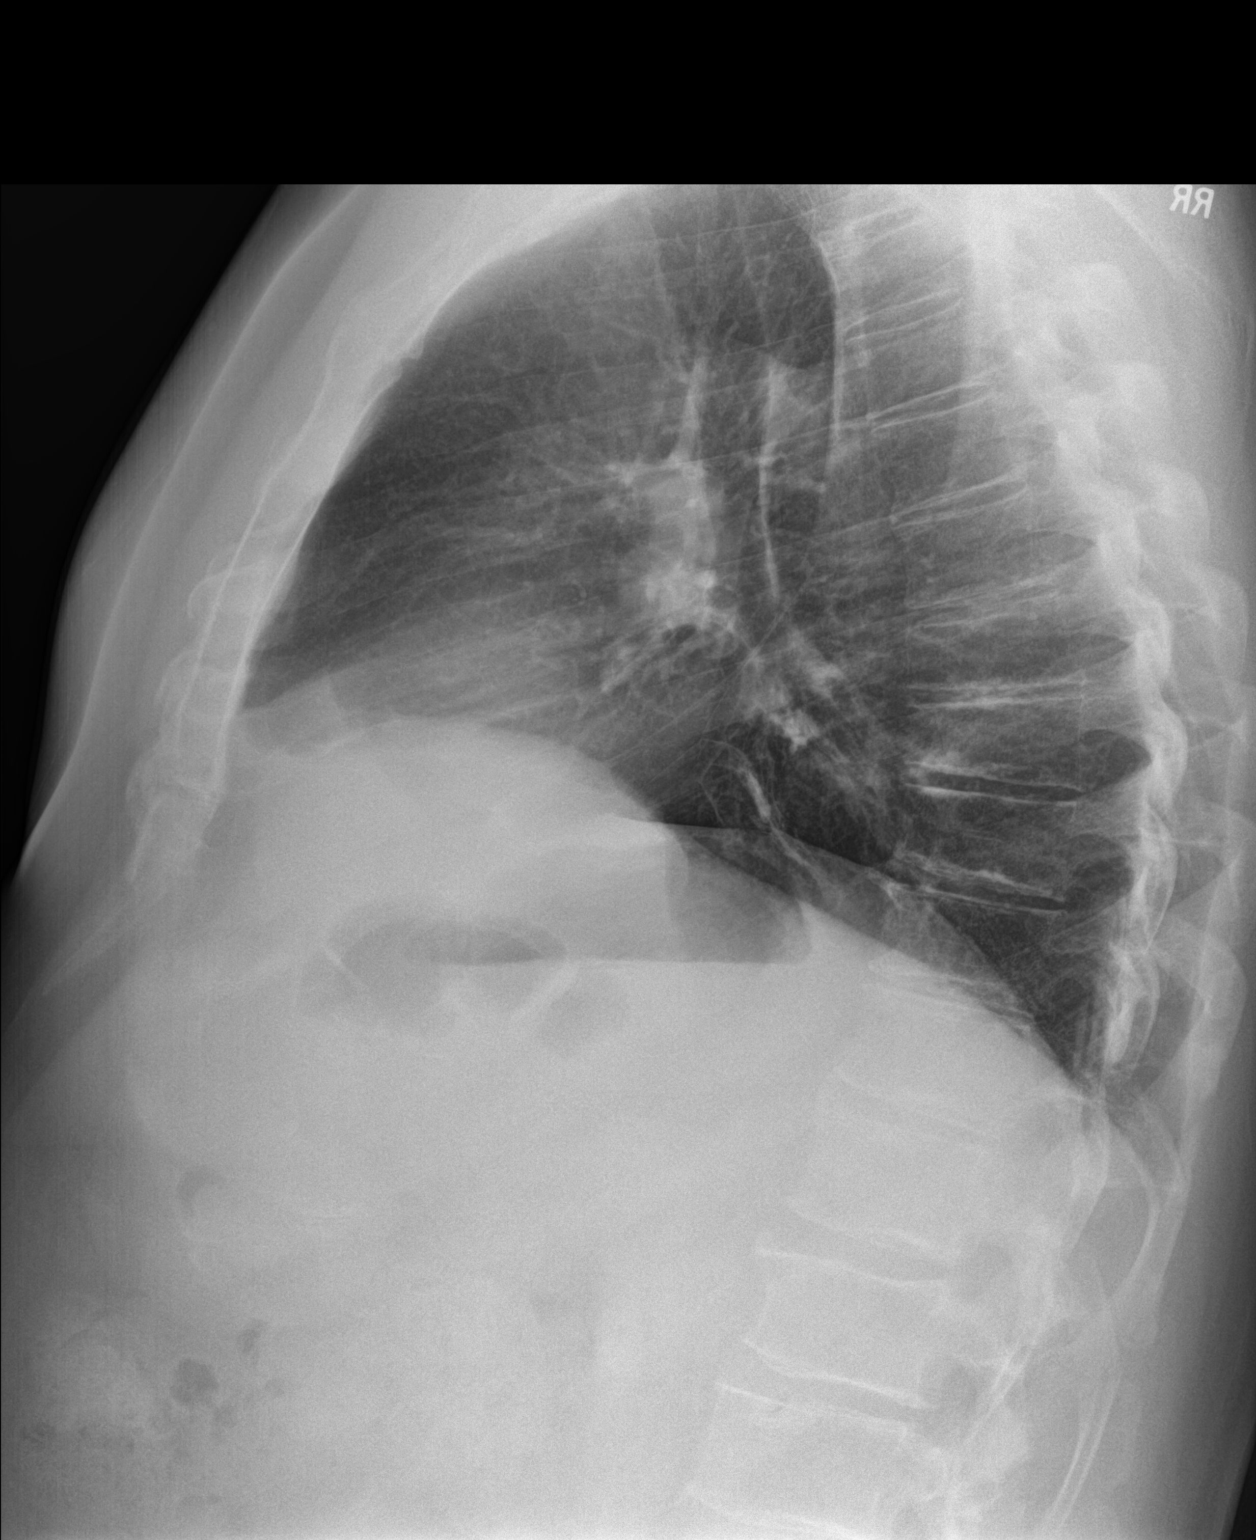

[2 of 2 positions shown; findings below may reference images not displayed]

FINDINGS: Low lung volumes. Minimal scarring at the right base. No acute
pulmonary infiltrate or effusion. Normal cardiomediastinal
silhouette. No pneumothorax.
IMPRESSION: Low lung volumes with minimal scarring at the right base. No
radiographic evidence for acute cardiopulmonary abnormality

## 2018-12-01 IMAGING — DX DG PORTABLE PELVIS
2 series · 2 of 2 positions shown · non-contrast
Comparison: Intraoperative films of earlier the same day.

CLINICAL DATA: Right total hip replacement

EXAM:
PORTABLE PELVIS 1-2 VIEWS

[pelvis ap]
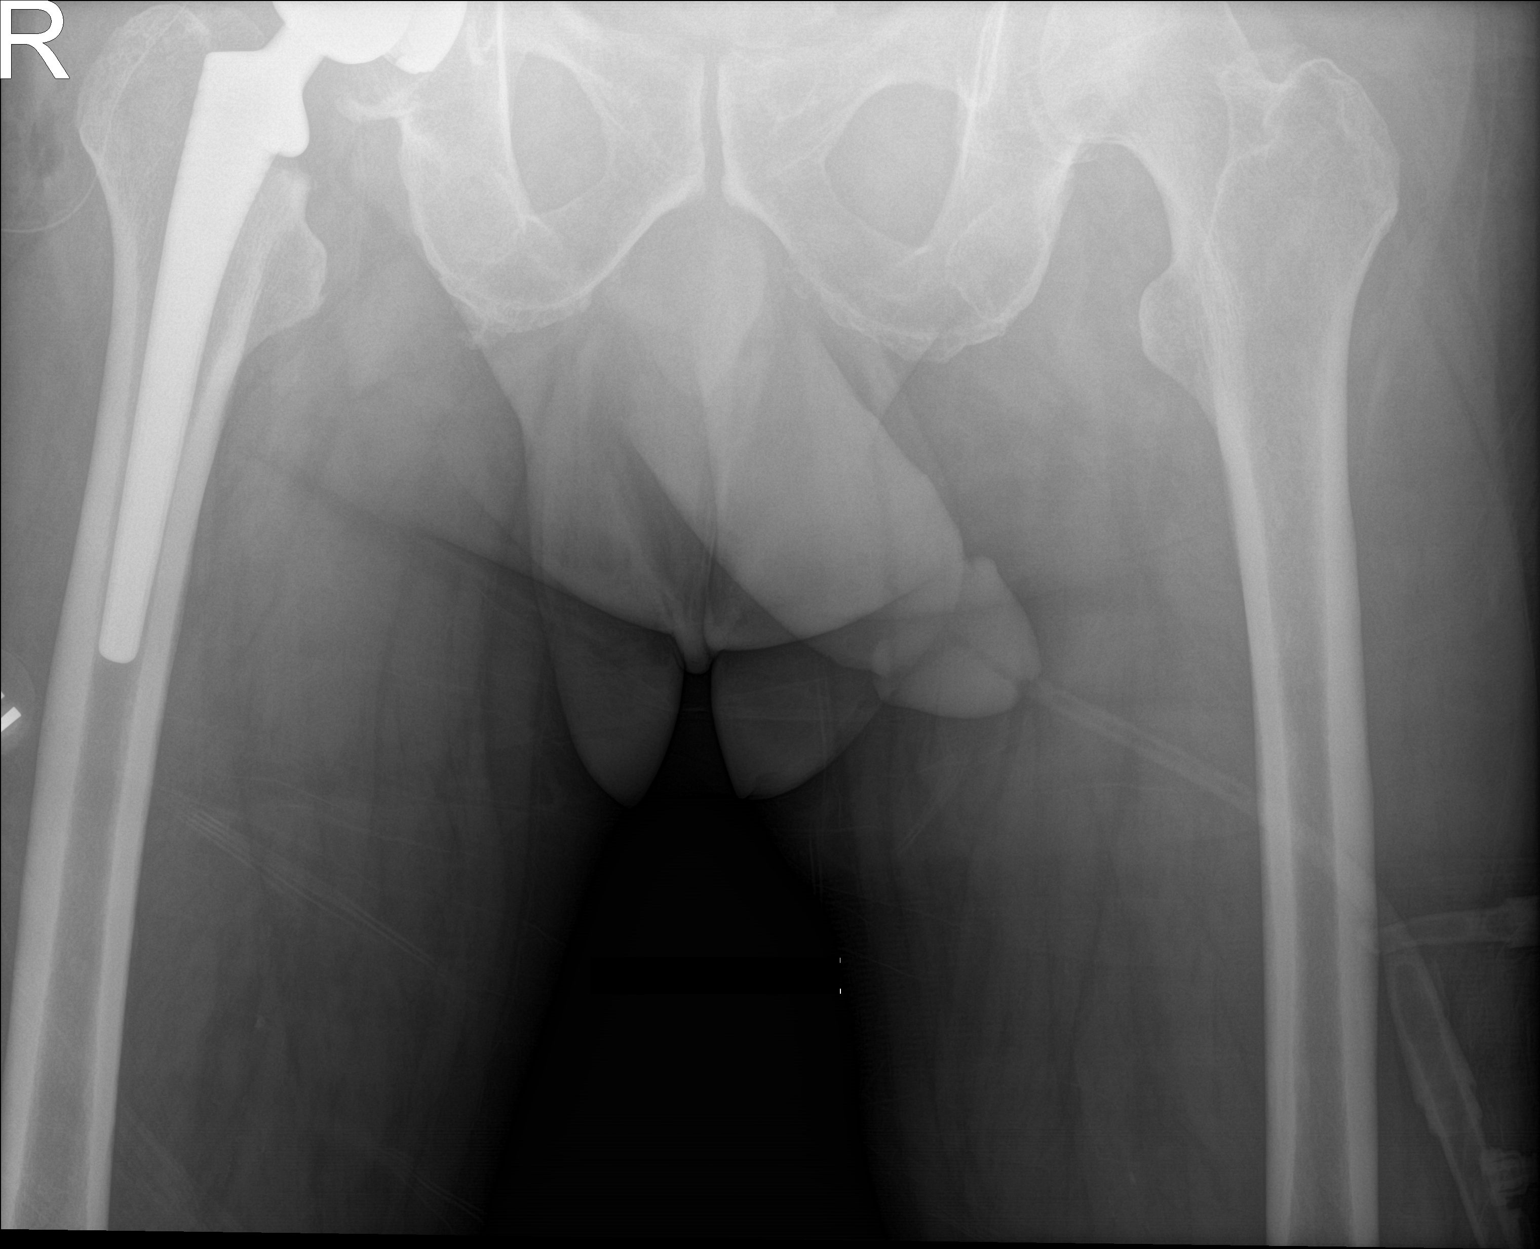

[pelvis lat]
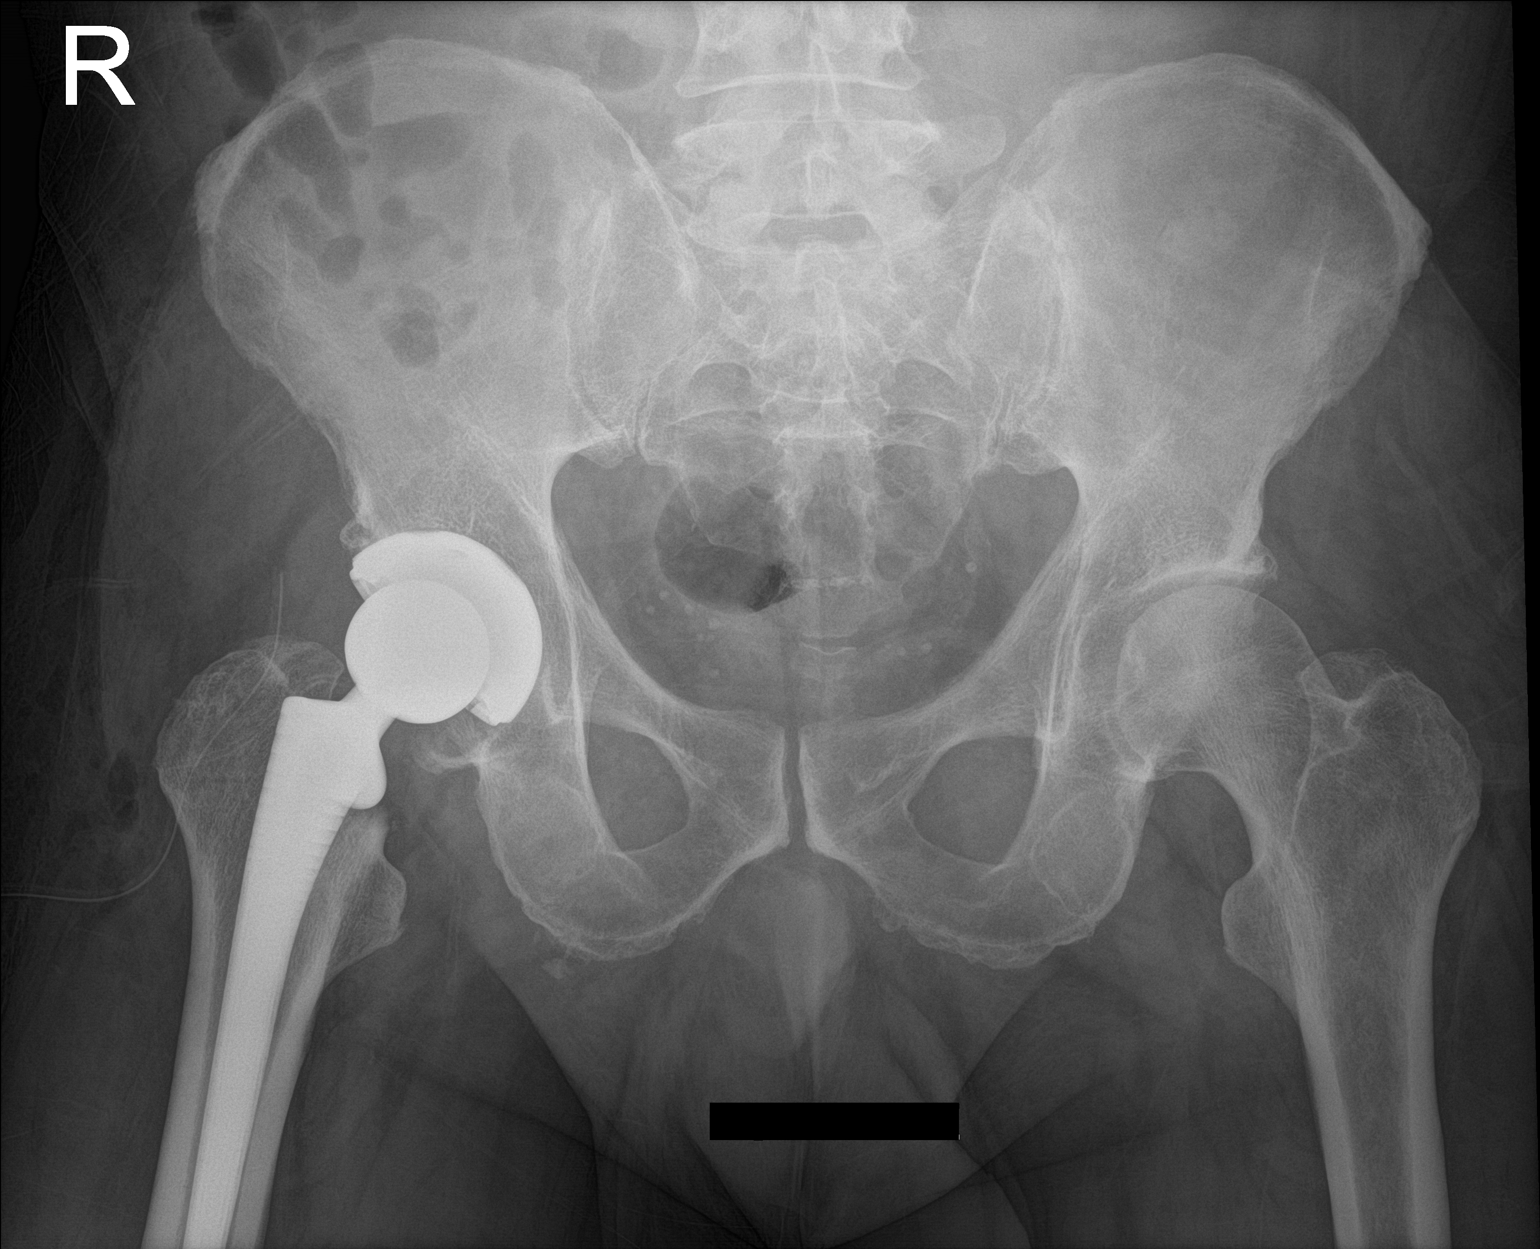

[2 of 2 positions shown; findings below may reference images not displayed]

FINDINGS: Portal wall frontal view of the pelvis shows the patient be status
post right total hip replacement. No evidence for immediate hardware
complications. Surgical drain noted laterally. Gas in the soft
tissues is compatible with the immediate postoperative state.
IMPRESSION: Status post right total hip replacement without evidence for
immediate hardware complications.

## 2020-07-25 ENCOUNTER — Telehealth: Payer: Self-pay | Admitting: Family Medicine

## 2020-07-25 ENCOUNTER — Other Ambulatory Visit: Payer: Self-pay

## 2020-07-25 ENCOUNTER — Telehealth (INDEPENDENT_AMBULATORY_CARE_PROVIDER_SITE_OTHER): Payer: Self-pay | Admitting: Registered Nurse

## 2020-07-25 DIAGNOSIS — U071 COVID-19: Secondary | ICD-10-CM

## 2020-07-25 MED ORDER — HYDROCODONE-HOMATROPINE 5-1.5 MG/5ML PO SYRP: 5.0000 mL | ORAL_SOLUTION | Freq: Three times a day (TID) | ORAL | 0 refills | Status: DC | PRN

## 2020-07-25 MED ORDER — AZELASTINE-FLUTICASONE 137-50 MCG/ACT NA SUSP: 1.0000 | Freq: Two times a day (BID) | NASAL | 1 refills | Status: DC

## 2020-07-25 MED ORDER — PREDNISONE 20 MG PO TABS: 20.0000 mg | ORAL_TABLET | Freq: Every day | ORAL | 0 refills | Status: DC

## 2020-07-25 NOTE — Telephone Encounter (Signed)
Encounter made in error. 

## 2020-07-25 NOTE — Progress Notes (Signed)
Telemedicine Encounter- SOAP NOTE Established Patient  This telephone encounter was conducted with the patient's (or proxy's) verbal consent via audio telecommunications: yes  Patient was instructed to have this encounter in a suitably private space; and to only have persons present to whom they give permission to participate. In addition, patient identity was confirmed by use of name plus two identifiers (DOB and address).  I discussed the limitations, risks, security and privacy concerns of performing an evaluation and management service by telephone and the availability of in person appointments. I also discussed with the patient that there may be a patient responsible charge related to this service. The patient expressed understanding and agreed to proceed.  I spent a total of 16 minutes talking with the patient or their proxy.  Patient at home Provider in office  Chief Complaint  Patient presents with  . Covid Positive    Pt had a COVID test on Sunday and was positive feeling bad starting wed last week. Notes sinus drainage, sore throat, fatigue, low grade fever    Subjective   Stephen Cannon is a 65 y.o. established patient. Telephone visit today for COVID  HPI Congestion, pnd, mild fever last week Tested positive at home on Sunday  Had coughing, congestion, and low grade temp over past few days. At home pulse ox showing 95-97, lowest at 93 Feeling fatigued, "washed out", sore throat, and headache Some diffuse joint pain - this is improving  Feels better after resting but gets worn out easily.  Vaccinated against covid - no booster. Pfizer, second dose in April 2021.   Patient Active Problem List   Diagnosis Date Noted  . OA (osteoarthritis) of hip 04/16/2017  . Allergic rhinitis due to pollen 06/27/2014  . 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 Primary snoring 06/27/2014  . Snoring 05/31/2014  . Low testosterone 01/03/2014  . Allergic conjunctivitis 01/29/2012  .  Hypercholesterolemia 08/01/2011    Past Medical History:  Diagnosis Date  . Arthritis   . Complication of anesthesia   . PONV (postoperative nausea and vomiting)     Current Outpatient Medications  Medication Sig Dispense Refill  . Azelastine-Fluticasone 137-50 MCG/ACT SUSP Place 1 spray into the nose every 12 (twelve) hours. 23 g 1  . HYDROcodone-homatropine (HYCODAN) 5-1.5 MG/5ML syrup Take 5 mLs by mouth every 8 (eight) hours as needed for cough. 120 mL 0  . predniSONE (DELTASONE) 20 MG tablet Take 1 tablet (20 mg total) by mouth daily with breakfast. 5 tablet 0  . amoxicillin-clavulanate (AUGMENTIN) 875-125 MG tablet Take 1 tablet by mouth 2 (two) times daily. (Patient not taking: Reported on 07/25/2020) 20 tablet 0   No current facility-administered medications for this visit.    No Known Allergies  Social History   Socioeconomic History  . Marital status: Married    Spouse name: Not on file  . Number of children: Not on file  . Years of education: Not on file  . Highest education level: Not on file  Occupational History  . Not on file  Tobacco Use  . Smoking status: Former Smoker    Quit date: 04/08/1981    Years since quitting: 39.3  . Smokeless tobacco: Never Used  Vaping Use  . Vaping Use: Never used  Substance and Sexual Activity  . Alcohol use: No    Alcohol/week: 0.0 standard drinks  . Drug use: No  . Sexual activity: Yes  Other Topics Concern  . Not on file  Social History Narrative   Caffeine  2 cups daily avg.   Social Determinants of Health   Financial Resource Strain: Not on file  Food Insecurity: Not on file  Transportation Needs: Not on file  Physical Activity: Not on file  Stress: Not on file  Social Connections: Not on file  Intimate Partner Violence: Not on file    ROS Per hpi   Objective   Vitals as reported by the patient: There were no vitals filed for this visit.  Jarris was seen today for covid positive.  Diagnoses and all  orders for this visit:  COVID-19 -     predniSONE (DELTASONE) 20 MG tablet; Take 1 tablet (20 mg total) by mouth daily with breakfast. -     HYDROcodone-homatropine (HYCODAN) 5-1.5 MG/5ML syrup; Take 5 mLs by mouth every 8 (eight) hours as needed for cough. -     Azelastine-Fluticasone 137-50 MCG/ACT SUSP; Place 1 spray into the nose every 12 (twelve) hours. -     Ambulatory referral for Covid Treatment   PLAN  Refer to covid treatment. Discussed with patient that given supply, he may or may not be considered for infusion.   At home treatment: prednisone burst, hycodan syrup, and azelastine-fluticasone nasal spray.   Discussed conservative and nonpharmacological: around the clock tylenol, hydration, adequate meals, rest.  Return to work no sooner than next  Monday  Patient encouraged to call clinic with any questions, comments, or concerns.   I discussed the assessment and treatment plan with the patient. The patient was provided an opportunity to ask questions and all were answered. The patient agreed with the plan and demonstrated an understanding of the instructions.   The patient was advised to call back or seek an in-person evaluation if the symptoms worsen or if the condition fails to improve as anticipated.  I provided 16 minutes of non-face-to-face time during this encounter.  Janeece Agee, NP  Primary Care at [facility]

## 2020-07-26 ENCOUNTER — Telehealth: Payer: Self-pay | Admitting: Physician Assistant

## 2020-07-26 NOTE — Telephone Encounter (Signed)
Called to discuss with patient about COVID-19 symptoms and the use of one of the available treatments for those with mild to moderate Covid symptoms and at a high risk of hospitalization.  Pt appears to qualify for outpatient treatment due to co-morbid conditions and/or a member of an at-risk group in accordance with the FDA Emergency Use Authorization.    Symptom onset: 07/20/20 Vaccinated: Yes Booster? No Immunocompromised? No Qualifiers: BMI > 25  Pt is qualified for the monoclonal antibody infusion, but due to medication shortages we cannot offer the pt the infusion at this time. he is feeling much better and today is last day to get MAB.  This decision was based on clinical judgement as well as the the NIH Covid 19 treatment guidelines for treatment prioritization and equitable access. Symptoms tier reviewed as well as criteria for ending isolation. Preventative practices reviewed. Patient verbalized understanding.  [place], [place]  07/26/2020 11:53 AM     Manson Passey

## 2021-01-04 ENCOUNTER — Other Ambulatory Visit: Payer: Self-pay

## 2021-01-04 ENCOUNTER — Encounter: Payer: Self-pay | Admitting: Family Medicine

## 2021-01-04 ENCOUNTER — Ambulatory Visit (INDEPENDENT_AMBULATORY_CARE_PROVIDER_SITE_OTHER): Payer: BC Managed Care – PPO | Admitting: Family Medicine

## 2021-01-04 VITALS — BP 136/62 | HR 88 | Temp 97.6°F | Ht 70.5 in | Wt 247.0 lb

## 2021-01-04 DIAGNOSIS — R351 Nocturia: Secondary | ICD-10-CM

## 2021-01-04 DIAGNOSIS — Z13220 Encounter for screening for lipoid disorders: Secondary | ICD-10-CM

## 2021-01-04 DIAGNOSIS — Z125 Encounter for screening for malignant neoplasm of prostate: Secondary | ICD-10-CM | POA: Diagnosis not present

## 2021-01-04 DIAGNOSIS — Z23 Encounter for immunization: Secondary | ICD-10-CM | POA: Diagnosis not present

## 2021-01-04 DIAGNOSIS — R319 Hematuria, unspecified: Secondary | ICD-10-CM

## 2021-01-04 DIAGNOSIS — Z131 Encounter for screening for diabetes mellitus: Secondary | ICD-10-CM | POA: Diagnosis not present

## 2021-01-04 DIAGNOSIS — Z0000 Encounter for general adult medical examination without abnormal findings: Secondary | ICD-10-CM | POA: Diagnosis not present

## 2021-01-04 DIAGNOSIS — L989 Disorder of the skin and subcutaneous tissue, unspecified: Secondary | ICD-10-CM

## 2021-01-04 LAB — LIPID PANEL
Cholesterol: 228 mg/dL — ABNORMAL HIGH (ref 0–200)
HDL: 61.4 mg/dL (ref >39.00)
LDL Cholesterol: 141 mg/dL — ABNORMAL HIGH (ref 0–99)
NonHDL: 166.57
Total CHOL/HDL Ratio: 4
Triglycerides: 130 mg/dL (ref 0.0–149.0)
VLDL: 26 mg/dL (ref 0.0–40.0)

## 2021-01-04 LAB — POCT URINALYSIS DIP (MANUAL ENTRY)
Bilirubin, UA: NEGATIVE
Glucose, UA: NEGATIVE mg/dL
Ketones, POC UA: NEGATIVE mg/dL
Leukocytes, UA: NEGATIVE
Nitrite, UA: NEGATIVE
Protein Ur, POC: NEGATIVE mg/dL
Spec Grav, UA: 1.025 (ref 1.010–1.025)
Urobilinogen, UA: NEGATIVE E.U./dL — AB
pH, UA: 6.5 (ref 5.0–8.0)

## 2021-01-04 LAB — COMPREHENSIVE METABOLIC PANEL
ALT: 32 U/L (ref 0–53)
AST: 23 U/L (ref 0–37)
Albumin: 4.5 g/dL (ref 3.5–5.2)
Alkaline Phosphatase: 45 U/L (ref 39–117)
BUN: 16 mg/dL (ref 6–23)
CO2: 28 mEq/L (ref 19–32)
Calcium: 9.3 mg/dL (ref 8.4–10.5)
Chloride: 103 mEq/L (ref 96–112)
Creatinine, Ser: 1.01 mg/dL (ref 0.40–1.50)
GFR: 79.4 mL/min (ref >60.00)
Glucose, Bld: 81 mg/dL (ref 70–99)
Potassium: 4.3 mEq/L (ref 3.5–5.1)
Sodium: 139 mEq/L (ref 135–145)
Total Bilirubin: 0.8 mg/dL (ref 0.2–1.2)
Total Protein: 6.7 g/dL (ref 6.0–8.3)

## 2021-01-04 LAB — URINALYSIS, MICROSCOPIC ONLY
RBC / HPF: NONE SEEN (ref 0–?)
WBC, UA: NONE SEEN (ref 0–?)

## 2021-01-04 LAB — PSA: PSA: 1.75 ng/mL (ref 0.10–4.00)

## 2021-01-04 LAB — HEMOGLOBIN A1C: Hgb A1c MFr Bld: 5.5 % (ref 4.6–6.5)

## 2021-01-04 NOTE — Progress Notes (Signed)
Subjective:  Patient ID: Stephen Cannon, male    DOB: [birth year]  Age: 65 y.o. MRN: [id number]  CC:  Chief Complaint  Patient presents with   Annual Exam    B/l lower frontal legs have little sore, on and off for--2-3 years.    HPI [name] presents for  Annual exam and other concern above.  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  He was seen by urology in 2019, Dr. Mena Goes with urgency and nocturia at that time.  Diagnosis BPH with LUTS, low PSA at that time.  Sildenafil has been prescribed for erectile dysfunction. Had previously had CT abdomen with somewhat thickened bladder wall as a result of lack of distention or bladder outlet obstruction with slight prominence of the prostate on CT abdomen in September 2015. No visible hematuria.  Nocturia 2-3 times per night - more in past few years.  No abd pain, night sweats, fever, weight loss.    Lower leg sores Bilateral lower legs, front of lower legs sores pop up off and on for the past 2 to 3 years.  No known injury. More in summer? None now. Out in the fields frequently.  Attempted treatments:neosporin.    Depression screen [facility] 2/9 01/04/2021 05/26/2018 08/05/2017 07/25/2017 03/31/2017  Decreased Interest 0 0 0 0 0  Down, Depressed, Hopeless 0 0 0 0 0  PHQ - 2 Score 0 0 0 0 0   Cancer screening: Colonoscopy 02/16/2018, 10 years.  Dr. Matthias Hughs No family history of prostate cancer. The natural history of prostate cancer and ongoing controversy regarding screening and potential treatment outcomes of prostate cancer has been discussed with the patient. The meaning of a false positive PSA and a false negative PSA has been discussed. He indicates understanding of the limitations of this screening test and wishes  to proceed with screening PSA testing. Lab Results  Component Value Date   PSA1 1.8 01/18/2017   PSA 1.80 10/24/2013    Immunization History  Administered Date(s) Administered    Influenza,inj,Quad PF,6+ Mos 05/26/2018, 01/04/2021   Influenza,inj,quad, With Preservative 02/06/2017   PFIZER(Purple Top)SARS-COV-2 Vaccination 06/07/2019   Tdap 01/18/2017  Flu vaccine: today.  Covid vaccine booster - not recent.   Vision Screening   Right eye Left eye Both eyes  Without correction 20/20 20/20 20/20   With correction     Saw retina specialist few months ago. Looked good.   Tobacco: none.   Alcohol: 1-2 every other day.   Dental: every 6 months.   Exercise: walking routine - getting back into it, eating cleaner to help with weigh loss.   Wt Readings from Last 3 Encounters:  01/04/21 247 lb (112 kg)  05/26/18 246 lb 3.2 oz (111.7 kg)  08/05/17 241 lb (109.3 kg)  Body mass index is 34.94 kg/m.  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   Primary snoring 06/27/2014   Snoring 05/31/2014   Low testosterone 01/03/2014   Allergic conjunctivitis 01/29/2012   Hypercholesterolemia 08/01/2011   Past Medical History:  Diagnosis Date   Arthritis    Complication of anesthesia    PONV (postoperative nausea and vomiting)    Past Surgical History:  Procedure Laterality Date   APPENDECTOMY     SINUS SURGERY WITH INSTATRAK     TOTAL HIP ARTHROPLASTY Right 04/16/2017   Procedure: RIGHT TOTAL HIP ARTHROPLASTY  ANTERIOR APPROACH;  Surgeon: Ollen Gross, MD;  Location: [facility];  Service: Orthopedics;  Laterality: Right;   No Known Allergies Prior to Admission medications   Not on File   Social History   Socioeconomic History   Marital status: Married    Spouse name: Not on file   Number of children: Not on file   Years of education: Not on file   Highest education level: Not on file  Occupational History   Not on file  Tobacco Use   Smoking status: Former    Types: Cigarettes    Quit date: 04/08/1981    Years since quitting: 39.7    Smokeless tobacco: Never  Vaping Use   Vaping Use: Never used  Substance and Sexual Activity   Alcohol use: Yes    Alcohol/week: 2.0 standard drinks    Types: 2 Cans of beer per week    Comment: social   Drug use: No   Sexual activity: Yes  Other Topics Concern   Not on file  Social History Narrative   Caffeine 2 cups daily avg.   Social Determinants of Health   Financial Resource Strain: Not on file  Food Insecurity: Not on file  Transportation Needs: Not on file  Physical Activity: Not on file  Stress: Not on file  Social Connections: Not on file  Intimate Partner Violence: Not on file    Review of Systems 13 point review of systems per patient health survey noted.  Negative other than as indicated above or in HPI.    Objective:   Vitals:   01/04/21 0825  BP: 136/62  Pulse: 88  Temp: 97.6 F (36.4 C)  SpO2: 97%  Weight: 247 lb (112 kg)  Height: 5' 10.5" (1.791 m)     Physical Exam Vitals reviewed.  Constitutional:      Appearance: He is well-developed. He is obese.  HENT:     Head: Normocephalic and atraumatic.     Right Ear: External ear normal.     Left Ear: External ear normal.  Eyes:     Conjunctiva/sclera: Conjunctivae normal.     Pupils: Pupils are equal, round, and reactive to light.  Neck:     Thyroid: No thyromegaly.  Cardiovascular:     Rate and Rhythm: Normal rate and regular rhythm.     Heart sounds: Normal heart sounds.  Pulmonary:     Effort: Pulmonary effort is normal. No respiratory distress.     Breath sounds: Normal breath sounds. No wheezing.  Abdominal:     General: There is no distension.     Palpations: Abdomen is soft. There is no mass.     Tenderness: There is no abdominal tenderness. There is no right CVA tenderness or left CVA tenderness.  Musculoskeletal:        General: No tenderness. Normal range of motion.     Cervical back: Normal range of motion and neck supple.  Lymphadenopathy:     Cervical: No cervical  adenopathy.  Skin:    General: Skin is warm and dry.     Findings: No lesion or rash.  Neurological:     Mental Status: He is alert and oriented to person, place, and time.     Deep Tendon Reflexes: Reflexes are normal and symmetric.  Psychiatric:        Behavior: Behavior normal.   Results for orders placed or performed in visit on 01/04/21  POCT urinalysis dipstick  Result Value Ref Range   Color, UA  yellow yellow   Clarity, UA clear clear   Glucose, UA negative negative mg/dL   Bilirubin, UA negative negative   Ketones, POC UA negative negative mg/dL   Spec Grav, UA 8.588 5.027 - 1.025   Blood, UA small (A) negative   pH, UA 6.5 5.0 - 8.0   Protein Ur, POC negative negative mg/dL   Urobilinogen, UA negative (A) 0.2 or 1.0 E.U./dL   Nitrite, UA Negative Negative   Leukocytes, UA Negative Negative       Assessment & Plan:  Joas Motton is a 65 y.o. male . Annual physical exam  - -anticipatory guidance as below in AVS, screening labs above. Health maintenance items as above in HPI discussed/recommended as applicable.   Need for immunization against influenza - Plan: Flu Vaccine QUAD 61mo+IM (Fluarix, Fluzone & Alfiuria Quad PF)  Hematuria, unspecified type - Plan: POCT urinalysis dipstick, Urine Microscopic, PSA, Ambulatory referral to Urology Nocturia - Plan: PSA, Ambulatory referral to Urology  -Asymptomatic in regards to hematuria, but some increased nocturia.  Previous questionable bladder wall thickening, BPH with LUTS, refer to urology for further work-up, exam.  PSA obtained.  Screening for diabetes mellitus - Plan: Comprehensive metabolic panel, Hemoglobin A1c  Screening for hyperlipidemia - Plan: Comprehensive metabolic panel, Lipid panel  Screening for prostate cancer - Plan: PSA  Leg lesion  -Reassuring exam at present, based on description certainly could be insect bites versus contact dermatitis.  Insect repellent discussed, RTC precautions if symptoms  return or to at least take a photo if that recurs.  No orders of the defined types were placed in this encounter.  Patient Instructions  If sores on legs return, please be seen at that time - take photo as well.  Could be insect bites. Applying mosquito repellant with Deet may be helpful for now.   I will refer you to urology for the blood in urine and urinary symptoms.   Return to the clinic or go to the nearest emergency room if any of your symptoms worsen or new symptoms occur.  [place] is now offering the bivalent Covid booster vaccine or can be given at your pharmacy. To schedule an appointment or to find more information, visit [email or web address]. You can also call [phone], Monday through Friday, 7 a.m. to 7 p.m.  Eating cleaner, increased walking for exercise. Goal of 150 minutes exercise per week spread out over most days.   Preventive Care 29-45 Years Old, Male Preventive care refers to lifestyle choices and visits with your health care provider that can promote health and wellness. This includes: A yearly physical exam. This is also called an annual wellness visit. Regular dental and eye exams. Immunizations. Screening for certain conditions. Healthy lifestyle choices, such as: Eating a healthy diet. Getting regular exercise. Not using drugs or products that contain nicotine and tobacco. Limiting alcohol use. What can I expect for my preventive care visit? Physical exam Your health care provider will check your: Height and weight. These may be used to calculate your BMI (body mass index). BMI is a measurement that tells if you are at a healthy weight. Heart rate and blood pressure. Body temperature. Skin for abnormal spots. Counseling Your health care provider may ask you questions about your: Past medical problems. Family's medical history. Alcohol, tobacco, and drug use. Emotional well-being. Home life and relationship well-being. Sexual  activity. Diet, exercise, and sleep habits. Work and work Astronomer. Access to firearms. What immunizations do I need? Vaccines are usually given at  various ages, according to a schedule. Your health care provider will recommend vaccines for you based on your age, medical history, and lifestyle or other factors, such as travel or where you work. What tests do I need? Blood tests Lipid and cholesterol levels. These may be checked every 5 years, or more often if you are over 76 years old. Hepatitis C test. Hepatitis B test. Screening Lung cancer screening. You may have this screening every year starting at age 96 if you have a 30-pack-year history of smoking and currently smoke or have quit within the past 15 years. Prostate cancer screening. Recommendations will vary depending on your family history and other risks. Genital exam to check for testicular cancer or hernias. Colorectal cancer screening. All adults should have this screening starting at age 64 and continuing until age 74. Your health care provider may recommend screening at age 45 if you are at increased risk. You will have tests every 1-10 years, depending on your results and the type of screening test. Diabetes screening. This is done by checking your blood sugar (glucose) after you have not eaten for a while (fasting). You may have this done every 1-3 years. STD (sexually transmitted disease) testing, if you are at risk. Follow these instructions at home: Eating and drinking  Eat a diet that includes fresh fruits and vegetables, whole grains, lean protein, and low-fat dairy products. Take vitamin and mineral supplements as recommended by your health care provider. Do not drink alcohol if your health care provider tells you not to drink. If you drink alcohol: Limit how much you have to 0-2 drinks a day. Be aware of how much alcohol is in your drink. In the [place] one drink equals one 12 oz bottle of beer (355 mL), one 5  oz glass of wine (148 mL), or one 1 oz glass of hard liquor (44 mL). Lifestyle Take daily care of your teeth and gums. Brush your teeth every morning and night with fluoride toothpaste. Floss one time each day. Stay active. Exercise for at least 30 minutes 5 or more days each week. Do not use any products that contain nicotine or tobacco, such as cigarettes, e-cigarettes, and chewing tobacco. If you need help quitting, ask your health care provider. Do not use drugs. If you are sexually active, practice safe sex. Use a condom or other form of protection to prevent STIs (sexually transmitted infections). If told by your health care provider, take low-dose aspirin daily starting at age 1. Find healthy ways to cope with stress, such as: Meditation, yoga, or listening to music. Journaling. Talking to a trusted person. Spending time with friends and family. Safety Always wear your seat belt while driving or riding in a vehicle. Do not drive: If you have been drinking alcohol. Do not ride with someone who has been drinking. When you are tired or distracted. While texting. Wear a helmet and other protective equipment during sports activities. If you have firearms in your house, make sure you follow all gun safety procedures. What's next? Go to your health care provider once a year for an annual wellness visit. Ask your health care provider how often you should have your eyes and teeth checked. Stay up to date on all vaccines. This information is not intended to replace advice given to you by your health care provider. Make sure you discuss any questions you have with your health care provider. Document Revised: 06/02/2020 Document Reviewed: 03/19/2018 Elsevier Patient Education  2022 [employer].  Signed,   [name], MD [facility], [facility] 01/04/21 8:57 AM

## 2021-01-04 NOTE — Patient Instructions (Addendum)
If sores on legs return, please be seen at that time - take photo as well.  Could be insect bites. Applying mosquito repellant with Deet may be helpful for now.   I will refer you to urology for the blood in urine and urinary symptoms. Return to the clinic or go to the nearest emergency room if any of your symptoms worsen or new symptoms occur.  [facility] is now offering the bivalent Covid booster vaccine or can be given at your pharmacy. To schedule an appointment or to find more information, visit [email or web address]. You can also call [phone], Monday through Friday, 7 a.m. to 7 p.m.  Eating cleaner, increased walking for exercise will likely help weight. Goal of 150 minutes exercise per week spread out over most days. I will check labs, but if borderline elevations then diet/exercise may be initial approach. Thanks for coming in today.   Preventive Care 51-34 Years Old, Male Preventive care refers to lifestyle choices and visits with your health care provider that can promote health and wellness. This includes: A yearly physical exam. This is also called an annual wellness visit. Regular dental and eye exams. Immunizations. Screening for certain conditions. Healthy lifestyle choices, such as: Eating a healthy diet. Getting regular exercise. Not using drugs or products that contain nicotine and tobacco. Limiting alcohol use. What can I expect for my preventive care visit? Physical exam Your health care provider will check your: Height and weight. These may be used to calculate your BMI (body mass index). BMI is a measurement that tells if you are at a healthy weight. Heart rate and blood pressure. Body temperature. Skin for abnormal spots. Counseling Your health care provider may ask you questions about your: Past medical problems. Family's medical history. Alcohol, tobacco, and drug use. Emotional well-being. Home life and relationship well-being. Sexual  activity. Diet, exercise, and sleep habits. Work and work Astronomer. Access to firearms. What immunizations do I need? Vaccines are usually given at various ages, according to a schedule. Your health care provider will recommend vaccines for you based on your age, medical history, and lifestyle or other factors, such as travel or where you work. What tests do I need? Blood tests Lipid and cholesterol levels. These may be checked every 5 years, or more often if you are over 31 years old. Hepatitis C test. Hepatitis B test. Screening Lung cancer screening. You may have this screening every year starting at age 61 if you have a 30-pack-year history of smoking and currently smoke or have quit within the past 15 years. Prostate cancer screening. Recommendations will vary depending on your family history and other risks. Genital exam to check for testicular cancer or hernias. Colorectal cancer screening. All adults should have this screening starting at age 11 and continuing until age 30. Your health care provider may recommend screening at age 87 if you are at increased risk. You will have tests every 1-10 years, depending on your results and the type of screening test. Diabetes screening. This is done by checking your blood sugar (glucose) after you have not eaten for a while (fasting). You may have this done every 1-3 years. STD (sexually transmitted disease) testing, if you are at risk. Follow these instructions at home: Eating and drinking  Eat a diet that includes fresh fruits and vegetables, whole grains, lean protein, and low-fat dairy products. Take vitamin and mineral supplements as recommended by your health care provider. Do not drink alcohol if your health care provider tells you  not to drink. If you drink alcohol: Limit how much you have to 0-2 drinks a day. Be aware of how much alcohol is in your drink. In the [place] one drink equals one 12 oz bottle of beer (355 mL), one 5  oz glass of wine (148 mL), or one 1 oz glass of hard liquor (44 mL). Lifestyle Take daily care of your teeth and gums. Brush your teeth every morning and night with fluoride toothpaste. Floss one time each day. Stay active. Exercise for at least 30 minutes 5 or more days each week. Do not use any products that contain nicotine or tobacco, such as cigarettes, e-cigarettes, and chewing tobacco. If you need help quitting, ask your health care provider. Do not use drugs. If you are sexually active, practice safe sex. Use a condom or other form of protection to prevent STIs (sexually transmitted infections). If told by your health care provider, take low-dose aspirin daily starting at age 92. Find healthy ways to cope with stress, such as: Meditation, yoga, or listening to music. Journaling. Talking to a trusted person. Spending time with friends and family. Safety Always wear your seat belt while driving or riding in a vehicle. Do not drive: If you have been drinking alcohol. Do not ride with someone who has been drinking. When you are tired or distracted. While texting. Wear a helmet and other protective equipment during sports activities. If you have firearms in your house, make sure you follow all gun safety procedures. What's next? Go to your health care provider once a year for an annual wellness visit. Ask your health care provider how often you should have your eyes and teeth checked. Stay up to date on all vaccines. This information is not intended to replace advice given to you by your health care provider. Make sure you discuss any questions you have with your health care provider. Document Revised: 06/02/2020 Document Reviewed: 03/19/2018 Elsevier Patient Education  2022 [employer].

## 2021-07-05 ENCOUNTER — Ambulatory Visit: Payer: BC Managed Care – PPO | Admitting: Family Medicine

## 2021-07-11 ENCOUNTER — Ambulatory Visit: Payer: BC Managed Care – PPO | Admitting: Family Medicine

## 2021-07-11 VITALS — BP 128/78 | HR 73 | Temp 97.8°F | Resp 17 | Ht 70.5 in | Wt 250.2 lb

## 2021-07-11 DIAGNOSIS — N401 Enlarged prostate with lower urinary tract symptoms: Secondary | ICD-10-CM

## 2021-07-11 DIAGNOSIS — E7800 Pure hypercholesterolemia, unspecified: Secondary | ICD-10-CM

## 2021-07-11 DIAGNOSIS — R0609 Other forms of dyspnea: Secondary | ICD-10-CM

## 2021-07-11 DIAGNOSIS — H538 Other visual disturbances: Secondary | ICD-10-CM

## 2021-07-11 DIAGNOSIS — I700 Atherosclerosis of aorta: Secondary | ICD-10-CM | POA: Diagnosis not present

## 2021-07-11 DIAGNOSIS — R319 Hematuria, unspecified: Secondary | ICD-10-CM | POA: Diagnosis not present

## 2021-07-11 LAB — COMPREHENSIVE METABOLIC PANEL
ALT: 29 U/L (ref 0–53)
AST: 23 U/L (ref 0–37)
Albumin: 4.7 g/dL (ref 3.5–5.2)
Alkaline Phosphatase: 48 U/L (ref 39–117)
BUN: 20 mg/dL (ref 6–23)
CO2: 27 mEq/L (ref 19–32)
Calcium: 9.4 mg/dL (ref 8.4–10.5)
Chloride: 103 mEq/L (ref 96–112)
Creatinine, Ser: 0.95 mg/dL (ref 0.40–1.50)
GFR: 85.14 mL/min (ref >60.00)
Glucose, Bld: 78 mg/dL (ref 70–99)
Potassium: 4.2 mEq/L (ref 3.5–5.1)
Sodium: 139 mEq/L (ref 135–145)
Total Bilirubin: 0.6 mg/dL (ref 0.2–1.2)
Total Protein: 6.6 g/dL (ref 6.0–8.3)

## 2021-07-11 LAB — LIPID PANEL
Cholesterol: 249 mg/dL — ABNORMAL HIGH (ref 0–200)
HDL: 63.5 mg/dL (ref >39.00)
LDL Cholesterol: 159 mg/dL — ABNORMAL HIGH (ref 0–99)
NonHDL: 185.76
Total CHOL/HDL Ratio: 4
Triglycerides: 133 mg/dL (ref 0.0–149.0)
VLDL: 26.6 mg/dL (ref 0.0–40.0)

## 2021-07-11 NOTE — Patient Instructions (Addendum)
I recommend meeting with cardiology with the shortness of breath over time.  Based on aortic atherosclerosis noted previously would recommend statin medicine but can be discussed with cardiology as well. Repeat labs today.  ? ?I recommend calling [facility] about CT results and further plans for blood in urine. Let me know if we need to call or request information.  ? ?Follow up with your eye specialist about the vision changes.  ? ?Return to the clinic or go to the nearest emergency room if any of your symptoms worsen or new symptoms occur. ? ?Thanks for coming in today.  Let me know if there are questions.   ?

## 2021-07-11 NOTE — Progress Notes (Signed)
? ?Subjective:  ?Patient ID: Stephen Cannon, male    DOB: 06/30/1955  Age: 66 y.o. MRN: [id number] ? ?CC:  ?Chief Complaint  ?Patient presents with  ? Hyperlipidemia  ?  Pt here for recheck   ? Referral  ?  Pt referred last visit with Dr Mena GoesEskridge and notes they did a scan wanted to check in with you as it has been some time   ? Health Maintenance  ?  Pt would like to discuss having shingrix today  ?  ? ? ?HPI ?Stephen LeschesKimball Kamel presents for  ? ?Hyperlipidemia: ?No current statin. Possible muscle symptoms on pravastatin prior.  Prior ASCVD risk score in September 2022 was 11.8. aortic atherosclerosis noted on CXR in 2018.  Weight has increased 3 pounds since that time. Has been trying to eat a little better.  ?Walking for exercise 3 days per week 25min, and walking dog. Some shortness of breath with fast walk as he has gotten heavier and older he gets. No difficulty with few flights of stairs. No chest pain.  ?Wt Readings from Last 3 Encounters:  ?07/11/21 250 lb 3.2 oz (113.5 kg)  ?01/04/21 247 lb (112 kg)  ?05/26/18 246 lb 3.2 oz (111.7 kg)  ? ?Lab Results  ?Component Value Date  ? CHOL 228 (H) 01/04/2021  ? HDL 61.40 01/04/2021  ? LDLCALC 141 (H) 01/04/2021  ? TRIG 130.0 01/04/2021  ? CHOLHDL 4 01/04/2021  ? ?Lab Results  ?Component Value Date  ? ALT 32 01/04/2021  ? AST 23 01/04/2021  ? ALKPHOS 45 01/04/2021  ? BILITOT 0.8 01/04/2021  ? ? ?Hematuria ?Refer to urology previously.  Appointment February 17 with Dr. Mena GoesEskridge noted.  CT scan was ordered with plan for continued surveillance for BPH with LUTS.  No new meds started.  I am unable see report of CT. Had performed 2 weeks ago - has not heard results. No hematuria. He has not called their office.  ? ?Health maintenance ?Shingles vaccine agreed, but defers to next week.  ?Immunization History  ?Administered Date(s) Administered  ? Influenza,inj,Quad PF,6+ Mos 05/26/2018, 01/04/2021  ? Influenza,inj,quad, With Preservative 02/06/2017  ? PFIZER(Purple  Top)SARS-COV-2 Vaccination 06/07/2019  ? Tdap 01/18/2017  ? ?Blurry vision: ?More past year - left worse than right, no acute changes. Last saw optho about a year ago. Plans to schedule appt. No change in thirst. Urinary frequency with BPH.  ? ? ?History ?Patient Active Problem List  ? Diagnosis Date Noted  ? OA (osteoarthritis) of hip 04/16/2017  ? Allergic rhinitis due to pollen 06/27/2014  ? 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 Primary snoring 06/27/2014  ? Snoring 05/31/2014  ? Low testosterone 01/03/2014  ? Allergic conjunctivitis 01/29/2012  ? Hypercholesterolemia 08/01/2011  ? ?Past Medical History:  ?Diagnosis Date  ? Arthritis   ? Complication of anesthesia   ? PONV (postoperative nausea and vomiting)   ? ?Past Surgical History:  ?Procedure Laterality Date  ? APPENDECTOMY    ? SINUS SURGERY WITH INSTATRAK    ? TOTAL HIP ARTHROPLASTY Right 04/16/2017  ? Procedure: RIGHT TOTAL HIP ARTHROPLASTY ANTERIOR APPROACH;  Surgeon: Ollen GrossAluisio, Frank, MD;  Location: [facility];  Service: Orthopedics;  Laterality: Right;  ? ?No Known Allergies ?Prior to Admission medications   ?Medication Sig Start Date End Date Taking? Authorizing Provider  ?Ascorbic Acid (VITAMIN C) 100 MG tablet Take 100 mg by mouth daily.   Yes [provider]  ?Cholecalciferol (VITAMIN D) 50 MCG (2000 UT) CAPS Take by mouth.   Yes [provider]  ?Turmeric (QC TUMERIC COMPLEX) 500 MG CAPS Take by mouth.   Yes [provider]  ? ?Social History  ? ?Socioeconomic History  ? Marital status: Married  ?  Spouse name: Not on file  ? Number of children: Not on file  ? Years of education: Not on file  ? Highest education level: Not on file  ?Occupational History  ? Not on file  ?Tobacco Use  ? Smoking status: Former  ?  Types: Cigarettes  ?  Quit date: 04/08/1981  ?  Years since quitting: 40.2  ? Smokeless tobacco: Never  ?Vaping Use  ? Vaping Use: Never used  ?Substance and Sexual Activity  ? Alcohol use: Yes  ?  Alcohol/week: 2.0 standard drinks  ?   Types: 2 Cans of beer per week  ?  Comment: social  ? Drug use: No  ? Sexual activity: Yes  ?Other Topics Concern  ? Not on file  ?Social History Narrative  ? Caffeine 2 cups daily avg.  ? ?Social Determinants of Health  ? ?Financial Resource Strain: Not on file  ?Food Insecurity: Not on file  ?Transportation Needs: Not on file  ?Physical Activity: Not on file  ?Stress: Not on file  ?Social Connections: Not on file  ?Intimate Partner Violence: Not on file  ? ? ?Review of Systems  ?Constitutional:  Negative for fatigue and unexpected weight change.  ?Eyes:  Negative for visual disturbance.  ?Respiratory:  Positive for shortness of breath (only with prolonged walking, slowly progressive over the years.). Negative for cough and chest tightness.   ?Cardiovascular:  Negative for chest pain, palpitations and leg swelling.  ?Gastrointestinal:  Negative for abdominal pain and blood in stool.  ?Neurological:  Negative for dizziness, light-headedness and headaches.  ? ? ?Objective:  ? ?Vitals:  ? 07/11/21 0806  ?BP: 128/78  ?Pulse: 73  ?Resp: 17  ?Temp: 97.8 ?F (36.6 ?C)  ?TempSrc: Temporal  ?SpO2: 95%  ?Weight: 250 lb 3.2 oz (113.5 kg)  ?Height: 5' 10.5" (1.791 m)  ? ? ? ?Physical Exam ?Vitals reviewed.  ?Constitutional:   ?   Appearance: He is well-developed.  ?HENT:  ?   Head: Normocephalic and atraumatic.  ?Neck:  ?   Vascular: No carotid bruit or JVD.  ?Cardiovascular:  ?   Rate and Rhythm: Normal rate and regular rhythm.  ?   Heart sounds: Normal heart sounds. No murmur heard. ?Pulmonary:  ?   Effort: Pulmonary effort is normal.  ?   Breath sounds: Normal breath sounds. No rales.  ?Musculoskeletal:  ?   Right lower leg: No edema.  ?   Left lower leg: No edema.  ?Skin: ?   General: Skin is warm and dry.  ?Neurological:  ?   Mental Status: He is alert and oriented to person, place, and time.  ?Psychiatric:     ?   Mood and Affect: Mood normal.  ? ? ?EKG: EKG sinus rhythm, rate 64.  No apparent acute ST or T wave changes,  or apparent changes form 03/27/17.  ? ? ?Assessment & Plan:  ?Friedrich Harriott is a 66 y.o. male . ?Hypercholesterolemia - Plan: Comprehensive metabolic panel, Lipid panel, Ambulatory referral to Cardiology ?DOE (dyspnea on exertion) - Plan: Ambulatory referral to Cardiology, EKG 12-Lead ?Atherosclerosis of aorta (HCC) - Plan: Ambulatory referral to Cardiology, EKG 12-Lead ? - EKG reassuring. Repeat labs.  Discussed likely need for statin, will refer to cardiology given atherosclerosis of aorta, dyspnea on exertion but notes  this has been more of an issue over time and his weight is increased.  Denies chest pain.  ? ?Hematuria, unspecified type ?Benign prostatic hyperplasia with lower urinary tract symptoms, symptom details unspecified ? -Recent evaluation with urology, unknown CT results.  Advised to call their office initially then if no response we can request records. ? ?Blurred vision, bilateral ? -Progressive over time.  Denies acute changes.  Plans to follow-up with his eye specialist. [facility].  ? ? ?No orders of the defined types were placed in this encounter. ? ?Patient Instructions  ?I recommend meeting with cardiology with the shortness of breath over time.  Based on aortic atherosclerosis noted previously would recommend statin medicine but can be discussed with cardiology as well. Repeat labs today.  ? ?I recommend calling [facility] about CT results and further plans for blood in urine. Let me know if we need to call or request information.  ? ?Follow up with your eye specialist about the vision changes.  ? ?Return to the clinic or go to the nearest emergency room if any of your symptoms worsen or new symptoms occur. ? ? ? ? ?Signed,  ? ?[name], MD ?[facility], [facility] ?[facility] ?07/11/21 ?8:43 AM ? ? ?

## 2021-07-20 ENCOUNTER — Ambulatory Visit: Payer: BC Managed Care – PPO

## 2021-11-03 ENCOUNTER — Encounter (HOSPITAL_COMMUNITY): Payer: Self-pay

## 2021-11-03 ENCOUNTER — Ambulatory Visit (HOSPITAL_COMMUNITY)
Admission: EM | Admit: 2021-11-03 | Discharge: 2021-11-03 | Disposition: A | Discharge status: Home / Self Care | Payer: BC Managed Care – PPO | Attending: Physician Assistant | Admitting: Physician Assistant

## 2021-11-03 DIAGNOSIS — J019 Acute sinusitis, unspecified: Secondary | ICD-10-CM

## 2021-11-03 MED ORDER — AMOXICILLIN-POT CLAVULANATE 875-125 MG PO TABS: 1.0000 | ORAL_TABLET | Freq: Two times a day (BID) | ORAL | 0 refills | Status: AC

## 2021-11-03 NOTE — ED Provider Notes (Signed)
[facility]    CSN: [id number] Arrival date & time: 11/03/21  1004      History   Chief Complaint Chief Complaint  Patient presents with   Nasal Congestion    HPI Stephen Cannon is a 66 y.o. male.   Pt complains of sinus pressure and congestion that started over a week ago.  He reports he was starting to feel better, but sx became worse over the last few days.  She reports intermittent mild cough that does become worse at night when lying flat.  He denies fever, chills, wheezing, shortness of breath.  He has tried sinus rinses with no improvement.  He took a home covid test which was negative.     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 Authorizing Provider  amoxicillin-clavulanate (AUGMENTIN) 875-125 MG tablet Take 1 tablet by mouth every 12 (twelve) hours for 5 days. 11/03/21 11/08/21 Yes Ward, Tylene Fantasia, PA-C  Ascorbic Acid (VITAMIN C) 100 MG tablet Take 100 mg by mouth daily.    [provider]  Cholecalciferol (VITAMIN D) 50 MCG (2000 UT) CAPS Take by mouth.    [provider]  Turmeric (QC TUMERIC COMPLEX) 500 MG CAPS Take by mouth.     [provider]    Family History Family History  Problem Relation Age of Onset   Glaucoma Mother    Dementia Father     Social History Social History   Tobacco Use   Smoking status: Former    Types: Cigarettes    Quit date: 04/08/1981    Years since quitting: 40.6   Smokeless tobacco: Never  Vaping Use   Vaping Use: Never used  Substance Use Topics   Alcohol use: Yes    Alcohol/week: 2.0 standard drinks of alcohol    Types: 2 Cans of beer per week    Comment: social   Drug use: No     Allergies   Patient has no known allergies.   Review of Systems Review of Systems  Constitutional:  Negative for chills and fever.  HENT:  Positive for congestion, sinus pressure and sinus pain. Negative for ear pain and sore throat.   Eyes:  Negative for pain and visual disturbance.  Respiratory:  Positive for cough. Negative for shortness of breath.   Cardiovascular:  Negative for chest pain and palpitations.  Gastrointestinal:  Negative for abdominal pain and vomiting.  Genitourinary:  Negative for dysuria and hematuria.  Musculoskeletal:  Negative for arthralgias and back pain.  Skin:  Negative for color change and rash.  Neurological:  Negative for seizures and syncope.  All other systems reviewed and  are negative.    Physical Exam Triage Vital Signs ED Triage Vitals  Enc Vitals Group     BP 11/03/21 1028 127/79     Pulse Rate 11/03/21 1028 86     Resp 11/03/21 1028 16     Temp 11/03/21 1028 98.4 F (36.9 C)     Temp Source 11/03/21 1028 Oral     SpO2 11/03/21 1028 96 %     Weight --      Height --      Head Circumference --      Peak Flow --      Pain Score 11/03/21 1029 0     Pain Loc --      Pain Edu? --      Excl. in GC? --    No data found.  Updated Vital Signs BP 127/79 (BP Location: Left Arm)   Pulse 86   Temp 98.4 F (36.9 C) (Oral)   Resp 16   SpO2 96%   Visual Acuity Right Eye Distance:   Left Eye Distance:   Bilateral Distance:     Right Eye Near:   Left Eye Near:    Bilateral Near:     Physical Exam Vitals and nursing note reviewed.  Constitutional:      General: He is not in acute distress.    Appearance: He is well-developed.  HENT:     Head: Normocephalic and atraumatic.  Eyes:     Conjunctiva/sclera: Conjunctivae normal.  Cardiovascular:     Rate and Rhythm: Normal rate and regular rhythm.     Heart sounds: No murmur heard. Pulmonary:     Effort: Pulmonary effort is normal. No respiratory distress.     Breath sounds: Normal breath sounds.  Abdominal:     Palpations: Abdomen is soft.     Tenderness: There is no abdominal tenderness.  Musculoskeletal:        General: No swelling.     Cervical back: Neck supple.  Skin:    General: Skin is warm and dry.     Capillary Refill: Capillary refill takes less than 2 seconds.  Neurological:     Mental Status: He is alert.  Psychiatric:        Mood and Affect: Mood normal.      UC Treatments / Results  Labs (all labs ordered are listed, but only abnormal results are displayed) Labs Reviewed - No data to display  EKG   Radiology No results found.  Procedures Procedures (including critical care time)  Medications Ordered in UC Medications - No data to display  Initial Impression / Assessment and Plan / UC Course  I have reviewed the triage vital signs and the nursing notes.  Pertinent labs & imaging results that were available during my care of the patient were reviewed by me and considered in my medical decision making (see chart for details).     Acute sinusitis, will start on Augmentin.  Supportive care discussed. Return precautions discussed.  Final Clinical Impressions(s) / UC Diagnoses   Final diagnoses:  Acute non-recurrent sinusitis, unspecified location     Discharge Instructions      Use antibiotic as prescribed Can use Flonase daily and Mucinex  Can take Ibuprofen or Tylenol for headache  Drink plenty of fluids,  rest Return if no improvement or symptoms become worse    ED Prescriptions     Medication Sig Dispense Auth. Provider   amoxicillin-clavulanate (AUGMENTIN) 875-125 MG tablet Take 1 tablet by mouth every  12 (twelve) hours for 5 days. 10 tablet Ward, Tylene Fantasia, PA-C      PDMP not reviewed this encounter.   Ward, Tylene Fantasia, PA-C 11/03/21 1054

## 2021-11-03 NOTE — ED Triage Notes (Signed)
Pt states head and chest congestion with cough for over a week. States he has tried some OTC meds with no relief.

## 2021-11-03 NOTE — Discharge Instructions (Signed)
Use antibiotic as prescribed Can use Flonase daily and Mucinex  Can take Ibuprofen or Tylenol for headache  Drink plenty of fluids, rest Return if no improvement or symptoms become worse

## 2021-12-11 ENCOUNTER — Encounter: Payer: Self-pay | Admitting: Family Medicine

## 2021-12-11 NOTE — Telephone Encounter (Signed)
My understanding is that we can not forward other providers medical records. Would patient or GI have to directly request?

## 2021-12-12 NOTE — Telephone Encounter (Signed)
At this time these records are not in my possession nor in the current review file for Dr Neva Seat

## 2021-12-19 ENCOUNTER — Ambulatory Visit (INDEPENDENT_AMBULATORY_CARE_PROVIDER_SITE_OTHER): Payer: BC Managed Care – PPO | Admitting: Cardiology

## 2021-12-19 ENCOUNTER — Ambulatory Visit (HOSPITAL_BASED_OUTPATIENT_CLINIC_OR_DEPARTMENT_OTHER): Payer: BC Managed Care – PPO | Admitting: Cardiology

## 2021-12-19 ENCOUNTER — Encounter (HOSPITAL_BASED_OUTPATIENT_CLINIC_OR_DEPARTMENT_OTHER): Payer: Self-pay | Admitting: Cardiology

## 2021-12-19 VITALS — BP 130/76 | HR 69 | Ht 70.5 in | Wt 240.7 lb

## 2021-12-19 DIAGNOSIS — I700 Atherosclerosis of aorta: Secondary | ICD-10-CM

## 2021-12-19 DIAGNOSIS — E7800 Pure hypercholesterolemia, unspecified: Secondary | ICD-10-CM

## 2021-12-19 DIAGNOSIS — Z9189 Other specified personal risk factors, not elsewhere classified: Secondary | ICD-10-CM | POA: Diagnosis not present

## 2021-12-19 DIAGNOSIS — Z7189 Other specified counseling: Secondary | ICD-10-CM | POA: Diagnosis not present

## 2021-12-19 NOTE — Patient Instructions (Addendum)
Medication Instructions:  Your Physician recommend you continue on your current medication as directed.    *If you need a refill on your cardiac medications before your next appointment, please call your pharmacy*   Lab Work: None ordered today   Testing/Procedures: CT coronary calcium score.   Test locations:  [place]   This is $99 out of pocket.   Coronary CalciumScan A coronary calcium scan is an imaging test used to look for deposits of calcium and other fatty materials (plaques) in the inner lining of the blood vessels of the heart (coronary arteries). These deposits of calcium and plaques can partly clog and narrow the coronary arteries without producing any symptoms or warning signs. This puts a person at risk for a heart attack. This test can detect these deposits before symptoms develop. Tell a health care provider about: Any allergies you have. All medicines you are taking, including vitamins, herbs, eye drops, creams, and over-the-counter medicines. Any problems you or family members have had with anesthetic medicines. Any blood disorders you have. Any surgeries you have had. Any medical conditions you have. Whether you are pregnant or may be pregnant. What are the risks? Generally, this is a safe procedure. However, problems may occur, including: Harm to a pregnant woman and her unborn baby. This test involves the use of radiation. Radiation exposure can be dangerous to a pregnant woman and her unborn baby. If you are pregnant, you generally should not have this procedure done. Slight increase in the risk of cancer. This is because of the radiation involved in the test. What happens before the procedure? No preparation is needed for this procedure. What happens during the procedure? You will undress and remove any jewelry around your neck or chest. You will put on a hospital gown. Sticky electrodes will be placed on your chest. The electrodes will be  connected to an electrocardiogram (ECG) machine to record a tracing of the electrical activity of your heart. A CT scanner will take pictures of your heart. During this time, you will be asked to lie still and hold your breath for 2-3 seconds while a picture of your heart is being taken. The procedure may vary among health care providers and hospitals. What happens after the procedure? You can get dressed. You can return to your normal activities. It is up to you to get the results of your test. Ask your health care provider, or the department that is doing the test, when your results will be ready. Summary A coronary calcium scan is an imaging test used to look for deposits of calcium and other fatty materials (plaques) in the inner lining of the blood vessels of the heart (coronary arteries). Generally, this is a safe procedure. Tell your health care provider if you are pregnant or may be pregnant. No preparation is needed for this procedure. A CT scanner will take pictures of your heart. You can return to your normal activities after the scan is done. This information is not intended to replace advice given to you by your health care provider. Make sure you discuss any questions you have with your health care provider. Document Released: 09/21/2007 Document Revised: 02/12/2016 Document Reviewed: 02/12/2016 Elsevier Interactive Patient Education  2017 [place]: At [facility], you and your health needs are our priority.  As part of our continuing mission to provide you with exceptional heart care, we have created designated Provider Care Teams.  These Care Teams include your primary  Cardiologist (physician) and Advanced Practice Providers (APPs -  Physician Assistants and Nurse Practitioners) who all work together to provide you with the care you need, when you need it.  We recommend signing up for the patient portal called "MyChart".  Sign up information is  provided on this After Visit Summary.  MyChart is used to connect with patients for Virtual Visits (Telemedicine).  Patients are able to view lab/test results, encounter notes, upcoming appointments, etc.  Non-urgent messages can be sent to your provider as well.   To learn more about what you can do with MyChart, go to [email or web address].    Your next appointment:   Based on test results   The format for your next appointment:   In Person  Provider:   Jodelle Red, MD      Recent data, summarized from [facility] from a study from the [facility]:  CropWizard.com.pt  "Researchers at the [employer] set out to answer this question by comparing statins to supplements in a clinical trial. They tracked the outcomes of 190 adults, ages 7 to 45. Some participants were given a 5 mg daily dose of rosuvastatin, a statin that is sold under the brand name Crestor for 28 days. Others were given supplements, including fish oil, cinnamon, garlic, turmeric, plant sterols or red yeast rice for the same period."  "What we found was that rosuvastatin lowered LDL cholesterol by almost 98% and that was vastly superior to placebo and any of the six supplements studied in the trial," study Kayren Eaves, M.D. of the [facility] told NPR. He says this level of reduction is enough to lower the risk of heart attacks and strokes. The findings are published in the Journal of the [employer] of Cardiology.  "Oftentimes these supplements are marketed as 'natural ways' to lower your cholesterol," says Laffin. But he says none of the dietary supplements demonstrated any significant decrease in LDL cholesterol compared with a placebo. LDL cholesterol is considered the 'bad cholesterol' because it can contribute to plaque build-up  in the artery walls - which can narrow the arteries, and set the stage for heart attacks and strokes"

## 2021-12-19 NOTE — Progress Notes (Signed)
Cardiology Office Note:    Date:  12/19/2021   ID:  Stephen Cannon, DOB 06-03-1955, MRN [id number]  PCP:  [name], MD  Cardiologist:  Jodelle Red, MD  Referring MD: [name], MD   CC: new patient consultation for aortic atherosclerosis, hyperlipidemia, dyspnea on exertion  History of Present Illness:    Stephen Cannon is a 66 y.o. male with a hx of arthritis who is seen as a new consult at the request of [name], MD for the evaluation and management of hyperlipidemia, dyspnea on exertion, and atherosclerosis of aorta.  On 11/03/2021 he presented to the ED with acute non-recurrent sinusitis. He reported that sinus pressure and congestion began over a week prior to the visit. He also complained of an intermittent cough that became worse at night when lying flat. He tried sinus rinses with no improvement. COVID test was negative. He was prescribed antibiotics and discharged.  He saw his PCP on 07/11/2021. Labs were ordered. It was recommended he call urology about CT results and further plans for blood in urine. He was also advised to follow up with eye specialist about his vision changes. He was referred to cardiology for follow up shortness of breath and aortic atherosclerosis.   Today:  He appears to be well.   When he saw Dr Neva Seat 07/11/2021, he recommended taking short walks about 5 days a week. At that time, he noticed shortness of breath during those walks, and especially going up hills.   He has been walking almost daily for 45 minutes and has lost about ten lbs since April. He has also been performing some calisthenics. Now he does not notice any shortness of breath when he walks, even when he walks particularly fast.  He states that at the GI doctor on Monday his blood pressure was 113/77. In clinic today, his blood pressure reading was 130/76.  He denies any palpitations, chest pain, shortness of breath, or peripheral edema. No  lightheadedness, headaches, syncope, orthopnea, or PND.   His father was severely overweight and had dementia. He also had cardiomegaly. His mother was also overweight and had health problems in her 59s. She ended up having a stroke, and also had ovarian cancer. His brother passed away, likely from cancer. His grandparents lived to about their late 71s-early 73s. Paternal grandmother has pancreatic cancer.   Past Medical History:  Diagnosis Date   Arthritis    Complication of anesthesia    PONV (postoperative nausea and vomiting)     Past Surgical History:  Procedure Laterality Date   APPENDECTOMY     SINUS SURGERY WITH INSTATRAK     TOTAL HIP ARTHROPLASTY Right 04/16/2017   Procedure: RIGHT TOTAL HIP ARTHROPLASTY ANTERIOR APPROACH;  Surgeon: Ollen Gross, MD;  Location: [facility];  Service: Orthopedics;  Laterality: Right;    Current Medications: Current Outpatient Medications on File Prior to Visit  Medication Sig   Ascorbic Acid (VITAMIN C) 100 MG tablet Take 100 mg by mouth daily.   Cholecalciferol (VITAMIN D) 50 MCG (2000 UT) CAPS Take by mouth.   Turmeric (QC TUMERIC COMPLEX) 500 MG CAPS Take by mouth.   No current facility-administered medications on file prior to visit.     Allergies:   Patient has no known allergies.   Social History   Tobacco Use   Smoking status: Former    Types: Cigarettes    Quit date: 04/08/1981    Years since quitting: 40.7   Smokeless tobacco: Never  Vaping Use   Vaping Use: Never used  Substance Use Topics   Alcohol use: Yes    Alcohol/week: 2.0 standard drinks of alcohol    Types: 2 Cans of beer per week    Comment: social   Drug use: No    Family History: family history includes Dementia in his father; Glaucoma in his mother.  ROS:   Please see the history of present illness.  Additional pertinent ROS: Constitutional: Negative for chills, fever, night sweats, unintentional weight loss  HENT: Negative for ear pain and hearing loss.    Eyes: Negative for loss of vision and eye pain.  Respiratory: Negative for cough, sputum, wheezing.   Cardiovascular: See HPI. Gastrointestinal: Negative for abdominal pain, melena, and hematochezia.  Genitourinary: Negative for dysuria and hematuria.  Musculoskeletal: Negative for falls and myalgias.  Skin: Negative for itching and rash.  Neurological: Negative for focal weakness, focal sensory changes and loss of consciousness.  Endo/Heme/Allergies: Does not bruise/bleed easily.     EKGs/Labs/Other Studies Reviewed:    The following studies were reviewed today: N/A  EKG:  EKG is personally reviewed.   12/19/21: NSR at 69 bpm  Recent Labs: 07/11/2021: ALT 29; BUN 20; Creatinine, Ser 0.95; Potassium 4.2; Sodium 139  Recent Lipid Panel    Component Value Date/Time   CHOL 249 (H) 07/11/2021 0857   CHOL 224 (H) 01/18/2017 1117   TRIG 133.0 07/11/2021 0857   HDL 63.50 07/11/2021 0857   HDL 60 01/18/2017 1117   CHOLHDL 4 07/11/2021 0857   VLDL 26.6 07/11/2021 0857   LDLCALC 159 (H) 07/11/2021 0857   LDLCALC 149 (H) 01/18/2017 1117    Physical Exam:    VS:  BP 130/76 (BP Location: Right Arm, Patient Position: Sitting, Cuff Size: Large)   Pulse 69   Ht 5' 10.5" (1.791 m)   Wt 240 lb 11.2 oz (109.2 kg)   BMI 34.05 kg/m     Wt Readings from Last 3 Encounters:  12/19/21 240 lb 11.2 oz (109.2 kg)  07/11/21 250 lb 3.2 oz (113.5 kg)  01/04/21 247 lb (112 kg)    GEN: Well nourished, well developed in no acute distress HEENT: Normal, moist mucous membranes NECK: No JVD CARDIAC: regular rhythm, normal S1 and S2, no rubs or gallops. No murmur. VASCULAR: Radial and DP pulses 2+ bilaterally. No carotid bruits RESPIRATORY:  Clear to auscultation without rales, wheezing or rhonchi  ABDOMEN: Soft, non-tender, non-distended MUSCULOSKELETAL:  Ambulates independently SKIN: Warm and dry, no edema NEUROLOGIC:  Alert and oriented x 3. No focal neuro deficits noted. PSYCHIATRIC:  Normal  affect    ASSESSMENT:    1. Aortic atherosclerosis (HCC)   2. Hypercholesterolemia   3. At increased risk for cardiovascular disease   4. Cardiac risk counseling   5. Counseling on health promotion and disease prevention    PLAN:    Aortic atherosclerosis Hypercholesterolemia Elevated ASCVD score -we reviewed the pros/cons of calcium scores. A cardiac CT scan for coronary calcium is a non-invasive way of obtaining information about the presence, location and extent of calcified plaque in the coronary arteries--the vessels that supply oxygen-containing blood to the heart muscle. Calcified plaque results when there is a build-up of fat and other substances under the inner layer of the artery. This material can calcify which signals the presence of atherosclerosis, a disease of the vessel wall, also called coronary artery disease.  People with this disease have an increased risk for heart attacks. In addition, over time, progression  of plaque build up (CAD) can narrow the arteries or even close off blood flow to the heart. Because calcium is a marker of CAD, the amount of calcium detected on a cardiac CT scan is a helpful prognostic tool.  -we reviewed the charts together which show the relationship between calcium score and 15 year all cause mortality -after shared decision making, will proceed with coronary calcium score. They understand this is an out of pocket/self pay test currently costing $99. -if calcium score nonzero, we did preliminarily discuss statin and the data for benefit  -tried statins in the past--?rosuvastatin made him light sensitive (question if this was doxycycline), pravastatin caused muscle weakness   Cardiac risk counseling and prevention recommendations: -recommend heart healthy/Mediterranean diet, with whole grains, fruits, vegetable, fish, lean meats, nuts, and olive oil. Limit salt. -recommend moderate walking, 3-5 times/week for 30-50 minutes each session. Aim for at  least 150 minutes.week. Goal should be pace of 3 miles/hours, or walking 1.5 miles in 30 minutes -recommend avoidance of tobacco products. Avoid excess alcohol. -ASCVD risk score: The 10-year ASCVD risk score (Arnett DK, et al., 2019) is: 11.9%   Values used to calculate the score:     Age: 89 years     Sex: Male     Is Non-Hispanic African American: No     Diabetic: No     Tobacco smoker: No     Systolic Blood Pressure: 127 mmHg     Is BP treated: No     HDL Cholesterol: 63.5 mg/dL     Total Cholesterol: 249 mg/dL    Plan for follow up: TBD based on calcium score.  Jodelle Red, MD, PhD, [facility]  [facility]    Medication Adjustments/Labs and Tests Ordered: Current medicines are reviewed at length with the patient today.  Concerns regarding medicines are outlined above.  Orders Placed This Encounter  Procedures   CT CARDIAC SCORING (SELF PAY ONLY)   EKG 12-Lead   No orders of the defined types were placed in this encounter.   Patient Instructions  Medication Instructions:  Your Physician recommend you continue on your current medication as directed.    *If you need a refill on your cardiac medications before your next appointment, please call your pharmacy*   Lab Work: None ordered today   Testing/Procedures: CT coronary calcium score.   Test locations:  [facility]   This is $99 out of pocket.   Coronary CalciumScan A coronary calcium scan is an imaging test used to look for deposits of calcium and other fatty materials (plaques) in the inner lining of the blood vessels of the heart (coronary arteries). These deposits of calcium and plaques can partly clog and narrow the coronary arteries without producing any symptoms or warning signs. This puts a person at risk for a heart attack. This test can detect these deposits before symptoms develop. Tell a health care provider about: Any allergies you have. All medicines you are taking,  including vitamins, herbs, eye drops, creams, and over-the-counter medicines. Any problems you or family members have had with anesthetic medicines. Any blood disorders you have. Any surgeries you have had. Any medical conditions you have. Whether you are pregnant or may be pregnant. What are the risks? Generally, this is a safe procedure. However, problems may occur, including: Harm to a pregnant woman and her unborn baby. This test involves the use of radiation. Radiation exposure can be dangerous to a pregnant woman and her unborn baby. If  you are pregnant, you generally should not have this procedure done. Slight increase in the risk of cancer. This is because of the radiation involved in the test. What happens before the procedure? No preparation is needed for this procedure. What happens during the procedure? You will undress and remove any jewelry around your neck or chest. You will put on a hospital gown. Sticky electrodes will be placed on your chest. The electrodes will be connected to an electrocardiogram (ECG) machine to record a tracing of the electrical activity of your heart. A CT scanner will take pictures of your heart. During this time, you will be asked to lie still and hold your breath for 2-3 seconds while a picture of your heart is being taken. The procedure may vary among health care providers and hospitals. What happens after the procedure? You can get dressed. You can return to your normal activities. It is up to you to get the results of your test. Ask your health care provider, or the department that is doing the test, when your results will be ready. Summary A coronary calcium scan is an imaging test used to look for deposits of calcium and other fatty materials (plaques) in the inner lining of the blood vessels of the heart (coronary arteries). Generally, this is a safe procedure. Tell your health care provider if you are pregnant or may be pregnant. No  preparation is needed for this procedure. A CT scanner will take pictures of your heart. You can return to your normal activities after the scan is done. This information is not intended to replace advice given to you by your health care provider. Make sure you discuss any questions you have with your health care provider. Document Released: 09/21/2007 Document Revised: 02/12/2016 Document Reviewed: 02/12/2016 Elsevier Interactive Patient Education  2017 [employer]    Follow-Up: At [facility], you and your health needs are our priority.  As part of our continuing mission to provide you with exceptional heart care, we have created designated Provider Care Teams.  These Care Teams include your primary Cardiologist (physician) and Advanced Practice Providers (APPs -  Physician Assistants and Nurse Practitioners) who all work together to provide you with the care you need, when you need it.  We recommend signing up for the patient portal called "MyChart".  Sign up information is provided on this After Visit Summary.  MyChart is used to connect with patients for Virtual Visits (Telemedicine).  Patients are able to view lab/test results, encounter notes, upcoming appointments, etc.  Non-urgent messages can be sent to your provider as well.   To learn more about what you can do with MyChart, go to [email or web address].    Your next appointment:   Based on test results   The format for your next appointment:   In Person  Provider:   Jodelle RedBridgette Kalasia Crafton, MD      Recent data, summarized from [facility] from a study from the [facility]:  [email or web address].pthttps://www.npr.org/sections/health-shots/2021/02/11/9718602423/statins-vs-supplements-new-study-finds-one-is-vastly-superior-to-cut-cholesterol  "Researchers at the [facility] set out to answer this question by comparing statins to supplements in a clinical trial. They tracked the outcomes of 190 adults, ages 6340 to 4275. Some participants  were given a 5 mg daily dose of rosuvastatin, a statin that is sold under the brand name Crestor for 28 days. Others were given supplements, including fish oil, cinnamon, garlic, turmeric, plant sterols or red yeast rice for the same period."  "What we found was that rosuvastatin lowered LDL cholesterol by almost 16%38%  and that was vastly superior to placebo and any of the six supplements studied in the trial," study Kayren Eaves, M.D. of the [facility] told NPR. He says this level of reduction is enough to lower the risk of heart attacks and strokes. The findings are published in the Journal of the [employer] of Cardiology.  "Oftentimes these supplements are marketed as 'natural ways' to lower your cholesterol," says Laffin. But he says none of the dietary supplements demonstrated any significant decrease in LDL cholesterol compared with a placebo. LDL cholesterol is considered the 'bad cholesterol' because it can contribute to plaque build-up in the artery walls - which can narrow the arteries, and set the stage for heart attacks and strokes"     I,Breanna Adamick,acting as a scribe for Jodelle Red, MD.,have documented all relevant documentation on the behalf of Jodelle Red, MD,as directed by  Jodelle Red, MD while in the presence of Jodelle Red, MD.   I, Jodelle Red, MD, have reviewed all documentation for this visit. The documentation on 01/07/22 for the exam, diagnosis, procedures, and orders are all accurate and complete.   Signed, Jodelle Red, MD PhD 12/19/2021     [facility]

## 2022-01-14 ENCOUNTER — Encounter: Payer: BC Managed Care – PPO | Admitting: Family Medicine

## 2022-01-25 ENCOUNTER — Ambulatory Visit (HOSPITAL_BASED_OUTPATIENT_CLINIC_OR_DEPARTMENT_OTHER)
Admission: RE | Admit: 2022-01-25 | Discharge: 2022-01-25 | Disposition: A | Discharge status: Home / Self Care | Payer: BC Managed Care – PPO | Source: Ambulatory Visit | Attending: Cardiology | Admitting: Cardiology

## 2022-01-25 DIAGNOSIS — Z9189 Other specified personal risk factors, not elsewhere classified: Secondary | ICD-10-CM | POA: Insufficient documentation

## 2022-01-25 DIAGNOSIS — E7800 Pure hypercholesterolemia, unspecified: Secondary | ICD-10-CM | POA: Insufficient documentation

## 2022-01-25 DIAGNOSIS — I700 Atherosclerosis of aorta: Secondary | ICD-10-CM | POA: Insufficient documentation

## 2022-01-29 ENCOUNTER — Telehealth: Payer: Self-pay | Admitting: Cardiology

## 2022-01-29 MED ORDER — ROSUVASTATIN CALCIUM 10 MG PO TABS: 10.0000 mg | ORAL_TABLET | Freq: Every day | ORAL | 3 refills | Status: AC

## 2022-01-29 MED ORDER — ASPIRIN 81 MG PO TBEC: 81.0000 mg | DELAYED_RELEASE_TABLET | Freq: Every day | ORAL | 3 refills | Status: AC

## 2022-01-29 NOTE — Telephone Encounter (Signed)
Pt returning a call for ct scoring results

## 2022-01-29 NOTE — Telephone Encounter (Signed)
Results called to patient who verbalizes understanding! Patient currently on his worksite and needs to check his calendar when he gets back to the office. Offered patient 11/1 at 11:40 or 2:20 or 11/3 at 2:20. Patient will view his calendar and then send a mychart message for whichever day works best for him. He is okay with [place] sending new medications over to the pharmacy.    "Coronary calcium score of 722 placing him in the 90th percentile for age, race, sex matched control.   Would recommend LDL (bad cholesterol) goal of less than 70.  Previously had difficulty with pravastatin.  Would recommend addition of rosuvastatin 10 mg daily.  If he wanted could start 3 times per week.  Would additionally recommend taking aspirin 81 mg daily.   Recommend follow-up visit with Dr. Harrell Gave to review results."

## 2022-02-06 ENCOUNTER — Encounter (HOSPITAL_BASED_OUTPATIENT_CLINIC_OR_DEPARTMENT_OTHER): Payer: Self-pay | Admitting: Cardiology

## 2022-02-06 ENCOUNTER — Ambulatory Visit (HOSPITAL_BASED_OUTPATIENT_CLINIC_OR_DEPARTMENT_OTHER): Payer: BC Managed Care – PPO | Admitting: Cardiology

## 2022-02-06 VITALS — BP 122/79 | HR 64 | Ht 70.5 in | Wt 238.1 lb

## 2022-02-06 DIAGNOSIS — I2510 Atherosclerotic heart disease of native coronary artery without angina pectoris: Secondary | ICD-10-CM | POA: Diagnosis not present

## 2022-02-06 DIAGNOSIS — E7800 Pure hypercholesterolemia, unspecified: Secondary | ICD-10-CM | POA: Diagnosis not present

## 2022-02-06 DIAGNOSIS — Z7189 Other specified counseling: Secondary | ICD-10-CM | POA: Diagnosis not present

## 2022-02-06 DIAGNOSIS — Z712 Person consulting for explanation of examination or test findings: Secondary | ICD-10-CM | POA: Diagnosis not present

## 2022-02-06 DIAGNOSIS — Z713 Dietary counseling and surveillance: Secondary | ICD-10-CM

## 2022-02-06 NOTE — Patient Instructions (Signed)
Medication Instructions:  Your Physician recommend you continue on your current medication as directed.    *If you need a refill on your cardiac medications before your next appointment, please call your pharmacy*   Lab Work: Your provider has recommended lab work in 52months.  Please have this collected at [facility] [place]. The lab is open 8:00 am - 4:30 pm. Please avoid 12:00p - 1:00p for lunch hour. You do not need an appointment. Please go to [address] [place], [place] [address]. This is in the Primary Care office on the 3rd floor, let them know you are there for blood work and they will direct you to the lab.  If you have labs (blood work) drawn today and your tests are completely normal, you will receive your results only by: Orin (if you have MyChart) OR A paper copy in the mail If you have any lab test that is abnormal or we need to change your treatment, we will call you to review the results.   Testing/Procedures:  You are scheduled for an Exercise Stress Test   Please arrive 15 minutes prior to your appointment time for registration and insurance purposes.  The test will take approximately 45 minutes to complete.  How to prepare for your Exercise Stress Test: Do bring a list of your current medications with you.  If not listed below, you may take your medications as normal. Do wear comfortable clothes (no dresses or overalls) and walking shoes, tennis shoes preferred (no heels or open toed shoes are allowed) Do Not wear cologne, perfume, aftershave or lotions (deodorant is allowed).   If these instructions are not followed, your test will have to be rescheduled.  If you have questions or concerns about your appointment, you can call the Stress Lab at [phone].  If you cannot keep your appointment, please provide 24 hours notification to the Stress Lab, to avoid a possible $50 charge to your account    Follow-Up: At [facility], you and your health needs are our priority.  As part of our continuing mission to provide you with exceptional heart care, we have created designated Provider Care Teams.  These Care Teams include your primary Cardiologist (physician) and Advanced Practice Providers (APPs -  Physician Assistants and Nurse Practitioners) who all work together to provide you with the care you need, when you need it.  We recommend signing up for the patient portal called "MyChart".  Sign up information is provided on this After Visit Summary.  MyChart is used to connect with patients for Virtual Visits (Telemedicine).  Patients are able to view lab/test results, encounter notes, upcoming appointments, etc.  Non-urgent messages can be sent to your provider as well.   To learn more about what you can do with MyChart, go to [email or web address].    Your next appointment:   1 year(s)  The format for your next appointment:   In Person  Provider:   Buford Dresser, MD    Other Instructions

## 2022-02-06 NOTE — Progress Notes (Signed)
Cardiology Office Note:    Date:  02/06/2022   ID:  Stephen Cannon, DOB 1956-03-20, MRN [id number]  PCP:  Stephen Flood, MD  Cardiologist:  Stephen Red, MD  Referring MD: Stephen Flood, MD   CC: Follow-up for elevated calcium score  History of Present Illness:    Stephen Cannon is a 66 y.o. male with a hx of coronary artery disease, hyperlipidemia, aortic atherosclerosis, and arthritis, who is seen for follow-up today. He was initially seen 12/19/2021 as a new consult at the request of Stephen Flood, MD for the evaluation and management of hyperlipidemia, dyspnea on exertion, and atherosclerosis of aorta.  Family history: His father was severely overweight and had dementia. He also had cardiomegaly. His mother was also overweight and had health problems in her 70s. She ended up having a stroke, and also had ovarian cancer. His brother passed away, likely from cancer. His grandparents lived to about their late 44s-early 35s. Paternal grandmother has pancreatic cancer.   Calcium score 01/25/22: score of 722 (LAD: 279, LCA: 204, RCA 239). It was recommended he start 10 mg rosuvastatin, at least 3 times per week. Also recommended to begin taking 81 mg aspirin.  Today: He is accompanied by his wife. We reviewed the results of his calcium score at length. Previously he was on pravastatin, but this was stopped after he noticed LE myalgias.  Lately he has been walking for exercise. He notes that he is down 3 belt sizes. Was closer to 250 lbs, but lost weight to 238 lbs today.  For activity he often plays with their dog. Occasionally he runs up the stairs, then went to bend over and pick up the dog. He suddenly saw stars before his eyes. This does not occur every time.  Regarding his diet he enjoys cheddar cheese, but has been working on cutting back. They also use avocado oil when cooking meals. His breakfast may be Jasmine rice with eggs, bananas, blueberries. If he has  bread it will be 1-2 slices of sourdough.   He denies any palpitations, chest pain, or peripheral edema. No lightheadedness, headaches, syncope, orthopnea, or PND.   Past Medical History:  Diagnosis Date   Arthritis    Complication of anesthesia    PONV (postoperative nausea and vomiting)     Past Surgical History:  Procedure Laterality Date   APPENDECTOMY     SINUS SURGERY WITH INSTATRAK     TOTAL HIP ARTHROPLASTY Right 04/16/2017   Procedure: RIGHT TOTAL HIP ARTHROPLASTY ANTERIOR APPROACH;  Surgeon: Stephen Gross, MD;  Location: [facility];  Service: Orthopedics;  Laterality: Right;    Current Medications: Current Outpatient Medications on File Prior to Visit  Medication Sig   Ascorbic Acid (VITAMIN C) 100 MG tablet Take 100 mg by mouth daily.   Cholecalciferol (VITAMIN D) 50 MCG (2000 UT) CAPS Take by mouth.   aspirin EC 81 MG tablet Take 1 tablet (81 mg total) by mouth daily. Swallow whole. (Patient not taking: Reported on 02/06/2022)   rosuvastatin (CRESTOR) 10 MG tablet Take 1 tablet (10 mg total) by mouth daily. (Patient not taking: Reported on 02/06/2022)   No current facility-administered medications on file prior to visit.     Allergies:   Patient has no known allergies.   Social History   Tobacco Use   Smoking status: Former    Types: Cigarettes    Quit date: 04/08/1981    Years since quitting: 40.8   Smokeless tobacco: Never  Vaping Use  Vaping Use: Never used  Substance Use Topics   Alcohol use: Yes    Alcohol/week: 2.0 standard drinks of alcohol    Types: 2 Cans of beer per week    Comment: social   Drug use: No    Family History: family history includes Dementia in his father; Glaucoma in his mother.  ROS:   Please see the history of present illness. (+) Bendopnea All other systems are reviewed and negative.    EKGs/Labs/Other Studies Reviewed:    The following studies were reviewed today:  CT Calcium Score  01/25/2022: FINDINGS: Coronary  Calcium Score:   Left main: 0   Left anterior descending artery: 279   Left circumflex artery: 204   Right coronary artery: 239   Total: 722   Percentile: 90th   Pericardium: Normal.   Non-cardiac: See separate report from [facility].   IMPRESSION: 1. Coronary calcium score of 722. This was 90th percentile for age-, race-, and sex-matched controls.  EKG:  EKG is personally reviewed.   02/06/2022:  EKG was not ordered. 12/19/21: NSR at 69 bpm  Recent Labs: 07/11/2021: ALT 29; BUN 20; Creatinine, Ser 0.95; Potassium 4.2; Sodium 139   Recent Lipid Panel    Component Value Date/Time   CHOL 249 (H) 07/11/2021 0857   CHOL 224 (H) 01/18/2017 1117   TRIG 133.0 07/11/2021 0857   HDL 63.50 07/11/2021 0857   HDL 60 01/18/2017 1117   CHOLHDL 4 07/11/2021 0857   VLDL 26.6 07/11/2021 0857   LDLCALC 159 (H) 07/11/2021 0857   LDLCALC 149 (H) 01/18/2017 1117    Physical Exam:    VS:  BP 122/79 (BP Location: Left Arm, Patient Position: Sitting, Cuff Size: Large)   Pulse 64   Ht 5' 10.5" (1.791 m)   Wt 238 lb 1.6 oz (108 kg)   SpO2 94%   BMI 33.68 kg/m     Wt Readings from Last 3 Encounters:  02/06/22 238 lb 1.6 oz (108 kg)  12/19/21 240 lb 11.2 oz (109.2 kg)  07/11/21 250 lb 3.2 oz (113.5 kg)    GEN: Well nourished, well developed in no acute distress HEENT: Normal, moist mucous membranes NECK: No JVD CARDIAC: regular rhythm, normal S1 and S2, no rubs or gallops. No murmur. VASCULAR: Radial and DP pulses 2+ bilaterally. No carotid bruits RESPIRATORY:  Clear to auscultation without rales, wheezing or rhonchi  ABDOMEN: Soft, non-tender, non-distended MUSCULOSKELETAL:  Ambulates independently SKIN: Warm and dry, no edema NEUROLOGIC:  Alert and oriented x 3. No focal neuro deficits noted. PSYCHIATRIC:  Normal affect    ASSESSMENT:    1. Nonocclusive coronary atherosclerosis of native coronary artery   2. Pure hypercholesterolemia   3. Cardiac risk counseling    4. Encounter to discuss test results   5. Nutritional counseling     PLAN:    Aortic atherosclerosis Hypercholesterolemia Elevated ASCVD score Coronary calcium, consistent with nonobstructive CAD -reviewed results of calcium score -tried statins in the past--?rosuvastatin made him light sensitive (question if this was doxycycline), pravastatin caused muscle weakness -we recommended trialing aspirin and rosuvastatin. He has not yet started these. I recommended trialing them and contacting me if he does not tolerate. Would recheck labs after 3 mos -given his elevated calcium score >400, we discussed exercise treadmill stress test. He is amenable  Shared Decision Making/Informed Consent The risks [chest pain, shortness of breath, cardiac arrhythmias, dizziness, blood pressure fluctuations, myocardial infarction, stroke/transient ischemic attack, and life-threatening complications (estimated to be 1 in  10,000)], benefits (risk stratification, diagnosing coronary artery disease, treatment guidance) and alternatives of an exercise tolerance test were discussed in detail with Mr. Jividen and he agrees to proceed.   Updated to add: stress test without evidence of ischemia   Cardiac risk counseling and prevention recommendations: -recommend heart healthy/Mediterranean diet, with whole grains, fruits, vegetable, fish, lean meats, nuts, and olive oil. Limit salt. -recommend moderate walking, 3-5 times/week for 30-50 minutes each session. Aim for at least 150 minutes.week. Goal should be pace of 3 miles/hours, or walking 1.5 miles in 30 minutes -recommend avoidance of tobacco products. Avoid excess alcohol. -referred to [facility] today  Plan for follow up:  1 year pending results of treadmill stress test, or sooner as needed.  Total time of encounter: 48 minutes total time of encounter, including 43 minutes spent in face-to-face patient care. This time includes coordination of care and  counseling regarding above conditions. Remainder of non-face-to-face time involved reviewing chart documents/testing relevant to the patient encounter and documentation in the medical record.  Buford Dresser, MD, PhD, [facility]    Medication Adjustments/Labs and Tests Ordered: Current medicines are reviewed at length with the patient today.  Concerns regarding medicines are outlined above.   Orders Placed This Encounter  Procedures   Lipid panel   Hepatic function panel   Amb Referral To Provider Referral Exercise Program (P.R.E.P)   Cardiac Stress Test: Informed Consent Details: Physician/Practitioner Attestation; Transcribe to consent form and obtain patient signature   EXERCISE TOLERANCE TEST (ETT)   No orders of the defined types were placed in this encounter.  Patient Instructions  Medication Instructions:  Your Physician recommend you continue on your current medication as directed.    *If you need a refill on your cardiac medications before your next appointment, please call your pharmacy*   Lab Work: Your provider has recommended lab work in 42months.  Please have this collected at [facility]. The lab is open 8:00 am - 4:30 pm. Please avoid 12:00p - 1:00p for lunch hour. You do not need an appointment. Please go to [address] [place], [place] [address]. This is in the Primary Care office on the 3rd floor, let them know you are there for blood work and they will direct you to the lab.  If you have labs (blood work) drawn today and your tests are completely normal, you will receive your results only by: [facility] (if you have MyChart) OR A paper copy in the mail If you have any lab test that is abnormal or we need to change your treatment, we will call you to review the results.   Testing/Procedures:  You are scheduled for an Exercise Stress Test   Please arrive 15 minutes prior to your  appointment time for registration and insurance purposes.  The test will take approximately 45 minutes to complete.  How to prepare for your Exercise Stress Test: Do bring a list of your current medications with you.  If not listed below, you may take your medications as normal. Do wear comfortable clothes (no dresses or overalls) and walking shoes, tennis shoes preferred (no heels or open toed shoes are allowed) Do Not wear cologne, perfume, aftershave or lotions (deodorant is allowed).   If these instructions are not followed, your test will have to be rescheduled.  If you have questions or concerns about your appointment, you can call the Stress Lab at [phone].  If you cannot  keep your appointment, please provide 24 hours notification to the Stress Lab, to avoid a possible $50 charge to your account    Follow-Up: At [facility], you and your health needs are our priority.  As part of our continuing mission to provide you with exceptional heart care, we have created designated Provider Care Teams.  These Care Teams include your primary Cardiologist (physician) and Advanced Practice Providers (APPs -  Physician Assistants and Nurse Practitioners) who all work together to provide you with the care you need, when you need it.  We recommend signing up for the patient portal called "MyChart".  Sign up information is provided on this After Visit Summary.  MyChart is used to connect with patients for Virtual Visits (Telemedicine).  Patients are able to view lab/test results, encounter notes, upcoming appointments, etc.  Non-urgent messages can be sent to your provider as well.   To learn more about what you can do with MyChart, go to [email or web address].    Your next appointment:   1 year(s)  The format for your next appointment:   In Person  Provider:   Jodelle Red, MD    Other Instructions           I,Mathew Stumpf,acting as a scribe for Stephen Red, MD.,have documented all relevant documentation on the behalf of Stephen Red, MD,as directed by  Stephen Red, MD while in the presence of Stephen Red, MD.  I, Stephen Red, MD, have reviewed all documentation for this visit. The documentation on 02/06/22 for the exam, diagnosis, procedures, and orders are all accurate and complete.   Signed, Stephen Red, MD PhD 02/06/2022     [facility]

## 2022-02-07 ENCOUNTER — Telehealth (HOSPITAL_COMMUNITY): Payer: Self-pay

## 2022-02-07 NOTE — Telephone Encounter (Signed)
Spoke with the patient, detailed instructions given. He stated that he would be here for his test. Asked to call back with any questions. S.Gorden Stthomas EMTP 

## 2022-02-08 ENCOUNTER — Telehealth: Payer: Self-pay

## 2022-02-08 NOTE — Telephone Encounter (Signed)
Pt returned call. Will need an evening class.  Next evening class will begin in Jan 2024.  Will call him as we are starting class for intake

## 2022-02-08 NOTE — Telephone Encounter (Signed)
VMT pt requesting call back to discuss referral to PREP 

## 2022-02-12 ENCOUNTER — Ambulatory Visit (HOSPITAL_COMMUNITY): Payer: BC Managed Care – PPO | Attending: Cardiology

## 2022-02-12 DIAGNOSIS — I2510 Atherosclerotic heart disease of native coronary artery without angina pectoris: Secondary | ICD-10-CM | POA: Diagnosis not present

## 2022-02-12 LAB — EXERCISE TOLERANCE TEST
Angina Index: 0
Duke Treadmill Score: 10
Estimated workload: 10.9
Exercise duration (min): 9 min
Exercise duration (sec): 30 s
MPHR: 156 {beats}/min
Peak HR: 160 {beats}/min
Percent HR: 103 %
RPE: 18
Rest HR: 63 {beats}/min
ST Depression (mm): 0 mm

## 2022-03-07 ENCOUNTER — Encounter (HOSPITAL_BASED_OUTPATIENT_CLINIC_OR_DEPARTMENT_OTHER): Payer: Self-pay | Admitting: Cardiology

## 2022-03-07 ENCOUNTER — Encounter: Payer: BC Managed Care – PPO | Admitting: Family Medicine

## 2022-03-18 ENCOUNTER — Telehealth (INDEPENDENT_AMBULATORY_CARE_PROVIDER_SITE_OTHER): Payer: BC Managed Care – PPO | Admitting: Family Medicine

## 2022-03-18 ENCOUNTER — Encounter: Payer: Self-pay | Admitting: Family Medicine

## 2022-03-18 VITALS — Ht 70.5 in | Wt 228.0 lb

## 2022-03-18 DIAGNOSIS — U071 COVID-19: Secondary | ICD-10-CM | POA: Diagnosis not present

## 2022-03-18 MED ORDER — NIRMATRELVIR/RITONAVIR (PAXLOVID)TABLET: 3.0000 | ORAL_TABLET | Freq: Two times a day (BID) | ORAL | 0 refills | Status: AC

## 2022-03-18 NOTE — Progress Notes (Signed)
Virtual Visit via Video Note  I connected with Stephen Cannon on 03/18/22 at 3:12 PM by a video enabled telemedicine application and verified that I am speaking with the correct person using two identifiers.  Patient location: home, by self.  My location: office - [place].    I discussed the limitations, risks, security and privacy concerns of performing an evaluation and management service by telephone and the availability of in person appointments. I also discussed with the patient that there may be a patient responsible charge related to this service. The patient expressed understanding and agreed to proceed, consent obtained  Chief complaint:  Chief Complaint  Patient presents with   Covid Positive    Pt stated he woke up with a scratchy throat on Sunday, then he started having body aches and took a home test was positive. Pt has had a 99.9 temp yesterday and normal today, pt has a cough also    History of Present Illness: Deny Cannon is a 66 y.o. male  COVID-19 infection Sore throat 2 nights ago. Scratchy throat yesterday, then body aches, fatigue. Cough. Positive covid test yesterday.  Prior conference in [place] last week.  Temp 99.9 yesterday. Normal today.  Drinking fluids - water, gatorade No chest pain, shortness of breath or confusion.  Tired, run down last night.  No insomnia d/t cough.  Last took crestor last night.  Egfr 85 in April.   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   Primary snoring 06/27/2014   Snoring 05/31/2014   Low testosterone 01/03/2014   Allergic conjunctivitis 01/29/2012   Hypercholesterolemia 08/01/2011   Past Medical History:  Diagnosis Date   Arthritis    Complication of anesthesia    PONV (postoperative nausea and vomiting)    Past Surgical History:  Procedure Laterality  Date   APPENDECTOMY     SINUS SURGERY WITH INSTATRAK     TOTAL HIP ARTHROPLASTY Right 04/16/2017   Procedure: RIGHT TOTAL HIP ARTHROPLASTY ANTERIOR APPROACH;  Surgeon: Gaynelle Arabian, MD;  Location: [facility];  Service: Orthopedics;  Laterality: Right;   No Known Allergies Prior to Admission medications   Medication Sig Start Date End Date Taking? Authorizing Provider  acetaminophen (TYLENOL) 325 MG tablet Take 650 mg by mouth every 6 (six) hours as needed.   Yes [provider]  Ascorbic Acid (VITAMIN C) 100 MG tablet Take 100 mg by mouth daily.   Yes [provider]  aspirin EC 81 MG tablet Take 1 tablet (81 mg total) by mouth daily. Swallow whole. 01/29/22  Yes Loel Dubonnet, NP  Cholecalciferol (VITAMIN D) 50 MCG (2000 UT) CAPS Take by mouth.   Yes [provider]  co-enzyme Q-10 30 MG capsule Take 30 mg by mouth 3 (three) times daily.   Yes [provider]  ibuprofen (ADVIL) 400 MG tablet Take 400 mg by mouth every 6 (six) hours as needed.   Yes [provider]  rosuvastatin (CRESTOR) 10 MG tablet Take 1 tablet (10 mg total) by mouth daily. 01/29/22 01/24/23 Yes Loel Dubonnet, NP   Social History   Socioeconomic History   Marital status: Married    Spouse name: Not on file   Number of children: Not on file   Years of education: Not on file   Highest education level: Not on file  Occupational History  Not on file  Tobacco Use   Smoking status: Former    Types: Cigarettes    Quit date: 04/08/1981    Years since quitting: 40.9   Smokeless tobacco: Never  Vaping Use   Vaping Use: Never used  Substance and Sexual Activity   Alcohol use: Yes    Alcohol/week: 2.0 standard drinks of alcohol    Types: 2 Cans of beer per week    Comment: social   Drug use: No   Sexual activity: Yes  Other Topics Concern   Not on file  Social History Narrative   Caffeine 2 cups daily avg.   Social Determinants of Health   Financial Resource  Strain: Not on file  Food Insecurity: Not on file  Transportation Needs: Not on file  Physical Activity: Not on file  Stress: Not on file  Social Connections: Not on file  Intimate Partner Violence: Not on file    Observations/Objective: Vitals:   03/18/22 1407  Weight: 228 lb (103.4 kg)  Height: 5' 10.5" (1.791 m)  Nontoxic appearance on video, speaking in full sentences without respiratory distress, no audible stridor or wheeze.  Appropriate responses, all questions were answered with understanding of plan expressed.   Assessment and Plan: COVID-19 virus infection - Plan: nirmatrelvir/ritonavir EUA (PAXLOVID) 20 x 150 MG & 10 x 100MG TABS Day 2 of COVID-19 infection, appears to be suitable for home treatment at this time.  Antivirals discussed including potential risks including side effects and rebound COVID, would like to try Paxlovid.  Normal dose based on last GFR, stop Crestor for 1 week.  Symptomatic care with Mucinex, antipyretics, fluids, rest discussed.  Masking/isolation precautions per CDC discussed.  ER precautions discussed.  Follow Up Instructions: As needed with ER precautions.   I discussed the assessment and treatment plan with the patient. The patient was provided an opportunity to ask questions and all were answered. The patient agreed with the plan and demonstrated an understanding of the instructions.   The patient was advised to call back or seek an in-person evaluation if the symptoms worsen or if the condition fails to improve as anticipated.   Wendie Agreste, MD

## 2022-03-18 NOTE — Patient Instructions (Addendum)
Sorry to hear you are sick.  Tylenol or advil for body aches or fever.  Mucinex for cough.  Paxlovid sent to your pharmacy.  Do not take rosuvastatin for 1 week. Hope you feel better soon! Return to the clinic or go to the nearest emergency room if any of your symptoms worsen or new symptoms occur.  Your employer may have specific requirements for return to work, but this information is provided from the CDC. There is a link provided below for more information if needed.   Everyone who has presumed or confirmed COVID-19 should stay home and isolate from other people for at least 5 full days (day 0 is the first day of symptoms or the date of the day of the positive viral test for asymptomatic persons). You can end isolation after 5 full days if you are fever-free for 24 hours without the use of fever-reducing medication and your other symptoms have improved (Loss of taste and smell may persist for weeks or months after recovery and need not delay the end of isolation). You should continue to wear a well-fitting mask around others at home and in public for 5 additional days (day 6 through day 10) after the end of your 5-day isolation period. If you are unable to wear a mask when around others, you should continue to isolate for a full 10 days. Avoid people who have weakened immune systems or are more likely to get very sick from COVID-19, and nursing homes and other high-risk settings, until after at least 10 days.  If you continue to have fever or your other symptoms have not improved after 5 days of isolation, you should wait to end your isolation until you are fever-free for 24 hours without the use of fever-reducing medication and your other symptoms have improved. Continue to wear a well-fitting mask through day 10.   [email or web address]

## 2022-04-19 ENCOUNTER — Encounter: Payer: BC Managed Care – PPO | Admitting: Family Medicine

## 2022-05-16 ENCOUNTER — Encounter: Payer: BC Managed Care – PPO | Admitting: Family Medicine

## 2022-06-05 ENCOUNTER — Ambulatory Visit (INDEPENDENT_AMBULATORY_CARE_PROVIDER_SITE_OTHER): Payer: BC Managed Care – PPO | Admitting: Family Medicine

## 2022-06-05 ENCOUNTER — Encounter: Payer: Self-pay | Admitting: Family Medicine

## 2022-06-05 VITALS — BP 128/70 | HR 71 | Temp 98.9°F | Ht 70.0 in | Wt 242.4 lb

## 2022-06-05 DIAGNOSIS — Z0000 Encounter for general adult medical examination without abnormal findings: Secondary | ICD-10-CM | POA: Diagnosis not present

## 2022-06-05 DIAGNOSIS — E7800 Pure hypercholesterolemia, unspecified: Secondary | ICD-10-CM | POA: Diagnosis not present

## 2022-06-05 DIAGNOSIS — Z130 Encounter for screening for diseases of the blood and blood-forming organs and certain disorders involving the immune mechanism: Secondary | ICD-10-CM

## 2022-06-05 DIAGNOSIS — Z125 Encounter for screening for malignant neoplasm of prostate: Secondary | ICD-10-CM | POA: Diagnosis not present

## 2022-06-05 DIAGNOSIS — Z131 Encounter for screening for diabetes mellitus: Secondary | ICD-10-CM | POA: Diagnosis not present

## 2022-06-05 NOTE — Progress Notes (Signed)
Subjective:  Patient ID: Stephen Cannon, male    DOB: 1955/05/25  Age: 67 y.o. MRN: [id number]  CC:  Chief Complaint  Patient presents with   Annual Exam    Pt doing well     HPI [name] presents for Annual Exam  Urology - Dr. Junious Silk, prior hematuria - benign eval, with CT and cystoscopy. BPH with frequency, no changes or meds.  No visible hematuria,  Cardiology - Dr. Harrell Gave.  GI - Dr. Alessandra Bevels. Diverticulitis last year. Had colonoscopy late last year - all turned out ok. No recurrence.    Hyperlipidemia: Crestor 10 mg daily - no new side effects or myalgias.  History of nonocclusive coronary artery atherosclerosis.  Cardiology Dr. Harrell Gave.  Stress testing in November with good exercise capacity and no evidence of ischemia, After previous coronary calcium score 722 at 90th percentile Lab Results  Component Value Date   CHOL 249 (H) 07/11/2021   HDL 63.50 07/11/2021   LDLCALC 159 (H) 07/11/2021   TRIG 133.0 07/11/2021   CHOLHDL 4 07/11/2021   Lab Results  Component Value Date   ALT 29 07/11/2021   AST 23 07/11/2021   ALKPHOS 48 07/11/2021   BILITOT 0.6 07/11/2021         06/05/2022    3:20 PM 03/18/2022    2:05 PM 07/11/2021    8:08 AM 01/04/2021    8:17 AM 05/26/2018   11:15 AM  Depression screen PHQ 2/9  Decreased Interest 0 0 0 0 0  Down, Depressed, Hopeless 0 0 0 0 0  PHQ - 2 Score 0 0 0 0 0  Altered sleeping 0 0     Tired, decreased energy 0 0     Change in appetite 0 0     Feeling bad or failure about yourself  0 0     Trouble concentrating 0 0     Moving slowly or fidgety/restless 0 0     Suicidal thoughts 0 0     PHQ-9 Score 0 0       Health Maintenance  Topic Date Due   Zoster Vaccines- Shingrix (1 of 2) 06/17/2022 (Originally 12/13/2007)   COVID-19 Vaccine (2 - 2023-24 season) 06/21/2022 (Originally 12/07/2021)   INFLUENZA VACCINE  07/07/2022 (Originally 11/06/2021)   DTaP/Tdap/Td (2 - Td or Tdap) 01/19/2027   COLONOSCOPY (Pts  45-41yr Insurance coverage will need to be confirmed)  02/17/2028   Hepatitis C Screening  Completed   HIV Screening  Completed   HPV VACCINES  Aged Out  Colonoscopy late last year.  Prostate: does  not have family history of prostate cancer The natural history of prostate cancer and ongoing controversy regarding screening and potential treatment outcomes of prostate cancer has been discussed with the patient. The meaning of a false positive PSA and a false negative PSA has been discussed. He indicates understanding of the limitations of this screening test and wishes to proceed with screening PSA testing. Lab Results  Component Value Date   PSA1 1.8 01/18/2017   PSA 1.75 01/04/2021   PSA 1.80 10/24/2013    Immunization History  Administered Date(s) Administered   Influenza,inj,Quad PF,6+ Mos 05/26/2018, 01/04/2021   Influenza,inj,quad, With Preservative 02/06/2017   PFIZER(Purple Top)SARS-COV-2 Vaccination 06/07/2019   Tdap 01/18/2017  Shingles vaccine - declines today. Considering.  Covid booster - declines.  Flu vaccine - declines, considering ok to have at nurse visit with shingrix if needed.   No results found. Optho - within past year at retina  specialist.   Dental: Every 6 months.   Alcohol: less than 1-2 per week  Tobacco: none  Exercise: 5 days per week.  8 # lighter before holidays.    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   Primary snoring 06/27/2014   Snoring 05/31/2014   Low testosterone 01/03/2014   Allergic conjunctivitis 01/29/2012   Hypercholesterolemia 08/01/2011   Past Medical History:  Diagnosis Date   Arthritis    Complication of anesthesia    PONV (postoperative nausea and vomiting)    Past Surgical History:  Procedure Laterality Date   APPENDECTOMY     SINUS SURGERY WITH INSTATRAK     TOTAL HIP  ARTHROPLASTY Right 04/16/2017   Procedure: RIGHT TOTAL HIP ARTHROPLASTY ANTERIOR APPROACH;  Surgeon: Gaynelle Arabian, MD;  Location: [facility];  Service: Orthopedics;  Laterality: Right;   No Known Allergies Prior to Admission medications   Medication Sig Start Date End Date Taking? Authorizing Provider  acetaminophen (TYLENOL) 325 MG tablet Take 650 mg by mouth every 6 (six) hours as needed.   Yes [provider]  Ascorbic Acid (VITAMIN C) 100 MG tablet Take 100 mg by mouth daily.   Yes [provider]  aspirin EC 81 MG tablet Take 1 tablet (81 mg total) by mouth daily. Swallow whole. 01/29/22  Yes Loel Dubonnet, NP  Cholecalciferol (VITAMIN D) 50 MCG (2000 UT) CAPS Take by mouth.   Yes [provider]  co-enzyme Q-10 30 MG capsule Take 30 mg by mouth 3 (three) times daily.   Yes [provider]  rosuvastatin (CRESTOR) 10 MG tablet Take 1 tablet (10 mg total) by mouth daily. 01/29/22 01/24/23 Yes Loel Dubonnet, NP  ibuprofen (ADVIL) 400 MG tablet Take 400 mg by mouth every 6 (six) hours as needed.    [provider]   Social History   Socioeconomic History   Marital status: Married    Spouse name: Not on file   Number of children: Not on file   Years of education: Not on file   Highest education level: Not on file  Occupational History   Not on file  Tobacco Use   Smoking status: Former    Types: Cigarettes    Quit date: 04/08/1981    Years since quitting: 41.1   Smokeless tobacco: Never  Vaping Use   Vaping Use: Never used  Substance and Sexual Activity   Alcohol use: Yes    Alcohol/week: 2.0 standard drinks of alcohol    Types: 2 Cans of beer per week    Comment: social   Drug use: No   Sexual activity: Yes  Other Topics Concern   Not on file  Social History Narrative   Caffeine 2 cups daily avg.   Social Determinants of Health   Financial Resource Strain: Not on file  Food Insecurity: Not on file  Transportation Needs:  Not on file  Physical Activity: Not on file  Stress: Not on file  Social Connections: Not on file  Intimate Partner Violence: Not on file    Review of Systems 13 point review of systems per patient health survey noted.  Negative other than as indicated above or in HPI.    Objective:   Vitals:   06/05/22 1523  BP: 128/70  Pulse: 71  Temp: 98.9 F (37.2 C)  TempSrc: Temporal  SpO2: 96%  Weight: 242 lb 6.4 oz (110 kg)  Height: '5\' 10"'$  (1.778 m)     Physical Exam Vitals reviewed.  Constitutional:      Appearance: He is well-developed.  HENT:     Head: Normocephalic and atraumatic.     Right Ear: External ear normal.     Left Ear: External ear normal.  Eyes:     Conjunctiva/sclera: Conjunctivae normal.     Pupils: Pupils are equal, round, and reactive to light.  Neck:     Thyroid: No thyromegaly.  Cardiovascular:     Rate and Rhythm: Normal rate and regular rhythm.     Heart sounds: Normal heart sounds.  Pulmonary:     Effort: Pulmonary effort is normal. No respiratory distress.     Breath sounds: Normal breath sounds. No wheezing.  Abdominal:     General: There is no distension.     Palpations: Abdomen is soft.     Tenderness: There is no abdominal tenderness.  Musculoskeletal:        General: No tenderness. Normal range of motion.     Cervical back: Normal range of motion and neck supple.  Lymphadenopathy:     Cervical: No cervical adenopathy.  Skin:    General: Skin is warm and dry.  Neurological:     Mental Status: He is alert and oriented to person, place, and time.     Deep Tendon Reflexes: Reflexes are normal and symmetric.  Psychiatric:        Behavior: Behavior normal.        Assessment & Plan:  Janiel Runkel is a 67 y.o. male . Annual physical exam - Plan: Comprehensive metabolic panel, Lipid panel, PSA, Hemoglobin A1c - -anticipatory guidance as below in AVS, screening labs above. Health maintenance items as above in HPI  discussed/recommended as applicable.   Hypercholesterolemia - Plan: Comprehensive metabolic panel, Lipid panel  -Tolerating Crestor, continue same dose.  Recently refilled and 1 year refills by cardiology.  Screening for diabetes mellitus - Plan: Comprehensive metabolic panel, Hemoglobin A1c  Screening for prostate cancer - Plan: PSA   No orders of the defined types were placed in this encounter.  Patient Instructions  No med changes today. If any concerns on labs I will let you know.  Thanks for coming in today. Recheck in 1 year for physical, but happy to see you sooner if needed.   Preventive Care 42-21 Years Old, Male Preventive care refers to lifestyle choices and visits with your health care provider that can promote health and wellness. Preventive care visits are also called wellness exams. What can I expect for my preventive care visit? Counseling During your preventive care visit, your health care provider may ask about your: Medical history, including: Past medical problems. Family medical history. Current health, including: Emotional well-being. Home life and relationship well-being. Sexual activity. Lifestyle, including: Alcohol, nicotine or tobacco, and drug use. Access to firearms. Diet, exercise, and sleep habits. Safety issues such as seatbelt and bike helmet use. Sunscreen use. Work and work Statistician. Physical exam Your health care provider will check your: Height and weight. These may be used to calculate your BMI (body mass index). BMI is a measurement that tells if you are at a healthy weight. Waist circumference. This measures the distance around your waistline. This measurement also tells if you are at a healthy weight and may help predict your risk of certain diseases, such as type 2 diabetes and high blood pressure. Heart rate and blood pressure. Body  temperature. Skin for abnormal spots. What immunizations do I need?  Vaccines are usually given  at various ages, according to a schedule. Your health care provider will recommend vaccines for you based on your age, medical history, and lifestyle or other factors, such as travel or where you work. What tests do I need? Screening Your health care provider may recommend screening tests for certain conditions. This may include: Lipid and cholesterol levels. Diabetes screening. This is done by checking your blood sugar (glucose) after you have not eaten for a while (fasting). Hepatitis B test. Hepatitis C test. HIV (human immunodeficiency virus) test. STI (sexually transmitted infection) testing, if you are at risk. Lung cancer screening. Prostate cancer screening. Colorectal cancer screening. Talk with your health care provider about your test results, treatment options, and if necessary, the need for more tests. Follow these instructions at home: Eating and drinking  Eat a diet that includes fresh fruits and vegetables, whole grains, lean protein, and low-fat dairy products. Take vitamin and mineral supplements as recommended by your health care provider. Do not drink alcohol if your health care provider tells you not to drink. If you drink alcohol: Limit how much you have to 0-2 drinks a day. Know how much alcohol is in your drink. In the [place] one drink equals one 12 oz bottle of beer (355 mL), one 5 oz glass of wine (148 mL), or one 1 oz glass of hard liquor (44 mL). Lifestyle Brush your teeth every morning and night with fluoride toothpaste. Floss one time each day. Exercise for at least 30 minutes 5 or more days each week. Do not use any products that contain nicotine or tobacco. These products include cigarettes, chewing tobacco, and vaping devices, such as e-cigarettes. If you need help quitting, ask your health care provider. Do not use drugs. If you are sexually active, practice safe sex. Use a condom or other form of protection to prevent STIs. Take aspirin only as told by  your health care provider. Make sure that you understand how much to take and what form to take. Work with your health care provider to find out whether it is safe and beneficial for you to take aspirin daily. Find healthy ways to manage stress, such as: Meditation, yoga, or listening to music. Journaling. Talking to a trusted person. Spending time with friends and family. Minimize exposure to UV radiation to reduce your risk of skin cancer. Safety Always wear your seat belt while driving or riding in a vehicle. Do not drive: If you have been drinking alcohol. Do not ride with someone who has been drinking. When you are tired or distracted. While texting. If you have been using any mind-altering substances or drugs. Wear a helmet and other protective equipment during sports activities. If you have firearms in your house, make sure you follow all gun safety procedures. What's next? Go to your health care provider once a year for an annual wellness visit. Ask your health care provider how often you should have your eyes and teeth checked. Stay up to date on all vaccines. This information is not intended to replace advice given to you by your health care provider. Make sure you discuss any questions you have with your health care provider. Document Revised: 09/20/2020 Document Reviewed: 09/20/2020 Elsevier Patient Education  [place],   Merri Ray, MD [place], [facility] 06/05/22 4:04 PM

## 2022-06-05 NOTE — Patient Instructions (Addendum)
No med changes today. If any concerns on labs I will let you know.  Thanks for coming in today. Recheck in 1 year for physical, but happy to see you sooner if needed.   Preventive Care 64-65 Years Old, Male Preventive care refers to lifestyle choices and visits with your health care provider that can promote health and wellness. Preventive care visits are also called wellness exams. What can I expect for my preventive care visit? Counseling During your preventive care visit, your health care provider may ask about your: Medical history, including: Past medical problems. Family medical history. Current health, including: Emotional well-being. Home life and relationship well-being. Sexual activity. Lifestyle, including: Alcohol, nicotine or tobacco, and drug use. Access to firearms. Diet, exercise, and sleep habits. Safety issues such as seatbelt and bike helmet use. Sunscreen use. Work and work Statistician. Physical exam Your health care provider will check your: Height and weight. These may be used to calculate your BMI (body mass index). BMI is a measurement that tells if you are at a healthy weight. Waist circumference. This measures the distance around your waistline. This measurement also tells if you are at a healthy weight and may help predict your risk of certain diseases, such as type 2 diabetes and high blood pressure. Heart rate and blood pressure. Body temperature. Skin for abnormal spots. What immunizations do I need?  Vaccines are usually given at various ages, according to a schedule. Your health care provider will recommend vaccines for you based on your age, medical history, and lifestyle or other factors, such as travel or where you work. What tests do I need? Screening Your health care provider may recommend screening tests for certain conditions. This may include: Lipid and cholesterol levels. Diabetes screening. This is done by checking your blood sugar  (glucose) after you have not eaten for a while (fasting). Hepatitis B test. Hepatitis C test. HIV (human immunodeficiency virus) test. STI (sexually transmitted infection) testing, if you are at risk. Lung cancer screening. Prostate cancer screening. Colorectal cancer screening. Talk with your health care provider about your test results, treatment options, and if necessary, the need for more tests. Follow these instructions at home: Eating and drinking  Eat a diet that includes fresh fruits and vegetables, whole grains, lean protein, and low-fat dairy products. Take vitamin and mineral supplements as recommended by your health care provider. Do not drink alcohol if your health care provider tells you not to drink. If you drink alcohol: Limit how much you have to 0-2 drinks a day. Know how much alcohol is in your drink. In the [place] one drink equals one 12 oz bottle of beer (355 mL), one 5 oz glass of wine (148 mL), or one 1 oz glass of hard liquor (44 mL). Lifestyle Brush your teeth every morning and night with fluoride toothpaste. Floss one time each day. Exercise for at least 30 minutes 5 or more days each week. Do not use any products that contain nicotine or tobacco. These products include cigarettes, chewing tobacco, and vaping devices, such as e-cigarettes. If you need help quitting, ask your health care provider. Do not use drugs. If you are sexually active, practice safe sex. Use a condom or other form of protection to prevent STIs. Take aspirin only as told by your health care provider. Make sure that you understand how much to take and what form to take. Work with your health care provider to find out whether it is safe and beneficial for you to  take aspirin daily. Find healthy ways to manage stress, such as: Meditation, yoga, or listening to music. Journaling. Talking to a trusted person. Spending time with friends and family. Minimize exposure to UV radiation to reduce  your risk of skin cancer. Safety Always wear your seat belt while driving or riding in a vehicle. Do not drive: If you have been drinking alcohol. Do not ride with someone who has been drinking. When you are tired or distracted. While texting. If you have been using any mind-altering substances or drugs. Wear a helmet and other protective equipment during sports activities. If you have firearms in your house, make sure you follow all gun safety procedures. What's next? Go to your health care provider once a year for an annual wellness visit. Ask your health care provider how often you should have your eyes and teeth checked. Stay up to date on all vaccines. This information is not intended to replace advice given to you by your health care provider. Make sure you discuss any questions you have with your health care provider. Document Revised: 09/20/2020 Document Reviewed: 09/20/2020 Elsevier Patient Education  [place].

## 2022-06-06 LAB — COMPREHENSIVE METABOLIC PANEL
ALT: 34 U/L (ref 0–53)
AST: 37 U/L (ref 0–37)
Albumin: 4.6 g/dL (ref 3.5–5.2)
Alkaline Phosphatase: 50 U/L (ref 39–117)
BUN: 17 mg/dL (ref 6–23)
CO2: 27 mEq/L (ref 19–32)
Calcium: 9.6 mg/dL (ref 8.4–10.5)
Chloride: 102 mEq/L (ref 96–112)
Creatinine, Ser: 0.98 mg/dL (ref 0.40–1.50)
GFR: 81.51 mL/min (ref >60.00)
Glucose, Bld: 74 mg/dL (ref 70–99)
Potassium: 4.2 mEq/L (ref 3.5–5.1)
Sodium: 139 mEq/L (ref 135–145)
Total Bilirubin: 0.8 mg/dL (ref 0.2–1.2)
Total Protein: 6.7 g/dL (ref 6.0–8.3)

## 2022-06-06 LAB — LIPID PANEL
Cholesterol: 175 mg/dL (ref 0–200)
HDL: 75.4 mg/dL (ref >39.00)
LDL Cholesterol: 82 mg/dL (ref 0–99)
NonHDL: 99.5
Total CHOL/HDL Ratio: 2
Triglycerides: 86 mg/dL (ref 0.0–149.0)
VLDL: 17.2 mg/dL (ref 0.0–40.0)

## 2022-06-06 LAB — HEMOGLOBIN A1C: Hgb A1c MFr Bld: 5.5 % (ref 4.6–6.5)

## 2022-06-06 LAB — PSA: PSA: 2.1 ng/mL (ref 0.10–4.00)

## 2023-06-11 ENCOUNTER — Encounter: Payer: 59 | Admitting: Family Medicine

## 2023-06-11 ENCOUNTER — Telehealth: Payer: Self-pay | Admitting: Family Medicine

## 2023-06-11 ENCOUNTER — Encounter: Payer: Self-pay | Admitting: Family Medicine

## 2023-06-11 NOTE — Telephone Encounter (Signed)
 Called patient to ask about missed visit. He stated replied NO to the text reminder. He was in [place] and would reschedule after then.
